# Patient Record
Sex: Female | Born: 1990
Health system: Southern US, Community
[De-identification: ages and names within clinical notes are randomized; demographics above are authoritative.]

## PROBLEM LIST (undated history)

## (undated) DIAGNOSIS — G43909 Migraine, unspecified, not intractable, without status migrainosus: Secondary | ICD-10-CM

## (undated) DIAGNOSIS — J45909 Unspecified asthma, uncomplicated: Secondary | ICD-10-CM

## (undated) DIAGNOSIS — M549 Dorsalgia, unspecified: Secondary | ICD-10-CM

## (undated) HISTORY — PX: WISDOM TOOTH EXTRACTION: SHX21

## (undated) HISTORY — PX: PLANTAR'S WART EXCISION: SHX2240

## (undated) HISTORY — DX: Migraine, unspecified, not intractable, without status migrainosus: G43.909

## (undated) HISTORY — DX: Unspecified asthma, uncomplicated: J45.909

## (undated) HISTORY — DX: Dorsalgia, unspecified: M54.9

---

## 2012-01-05 ENCOUNTER — Ambulatory Visit: Payer: Self-pay | Admitting: Audiology

## 2012-01-10 ENCOUNTER — Ambulatory Visit: Payer: Medicaid Other | Attending: Audiology | Admitting: Audiology

## 2012-01-10 DIAGNOSIS — F802 Mixed receptive-expressive language disorder: Secondary | ICD-10-CM | POA: Insufficient documentation

## 2016-12-21 ENCOUNTER — Ambulatory Visit (INDEPENDENT_AMBULATORY_CARE_PROVIDER_SITE_OTHER): Payer: Self-pay | Admitting: Physician Assistant

## 2016-12-21 ENCOUNTER — Encounter: Payer: Self-pay | Admitting: Physician Assistant

## 2016-12-21 VITALS — BP 117/80 | HR 83 | Temp 97.0°F | Ht 63.0 in | Wt 120.0 lb

## 2016-12-21 DIAGNOSIS — W902XXA Exposure to laser radiation, initial encounter: Secondary | ICD-10-CM | POA: Insufficient documentation

## 2016-12-21 DIAGNOSIS — N949 Unspecified condition associated with female genital organs and menstrual cycle: Secondary | ICD-10-CM

## 2016-12-21 MED ORDER — SILVER SULFADIAZINE 1 % EX CREA: 1.0000 "application " | TOPICAL_CREAM | Freq: Two times a day (BID) | CUTANEOUS | 1 refills | Status: DC

## 2016-12-21 MED ORDER — FLUCONAZOLE 150 MG PO TABS: ORAL_TABLET | ORAL | 2 refills | Status: DC

## 2016-12-21 NOTE — Patient Instructions (Signed)
In a few days you may receive a survey in the mail or online from [employer] regarding your visit with us today. Please take a moment to fill this out. Your feedback is very important to our whole office. It can help us better understand your needs as well as improve your experience and satisfaction. Thank you for taking your time to complete it. We care about you.  Cassity Christian, PA-C  

## 2016-12-21 NOTE — Progress Notes (Signed)
BP 117/80   Pulse 83   Temp (!) 97 F (36.1 C) (Oral)   Ht 5\' 3"  (1.6 m)   Wt 120 lb (54.4 kg)   LMP 12/14/2016   BMI 21.26 kg/m    Subjective:    Patient ID: Holly Parks, female    DOB: [birth year], 26 y.o.   MRN: [id number]  HPI: Holly Parks is a 26 y.o. female presenting on 12/21/2016 for Vaginal Itching (red and irritated )  Patient reports that she has had 2 laser hair treatments to the pubic area. These time she has had redness and swelling this last time has been quite significant. She is even having to postpone her treatment again. She has a picture of it 3 weeks ago where there was significant swelling on both labial surfaces. The pubic mons is without abnormality. The swelling has gone down a lot. There is no associated folliculitis.  Relevant past medical, surgical, family and social history reviewed and updated as indicated. Allergies and medications reviewed and updated.  History reviewed. No pertinent past medical history.  History reviewed. No pertinent surgical history.  Review of Systems  Constitutional: Negative.   HENT: Negative.   Eyes: Negative.   Respiratory: Negative.   Gastrointestinal: Negative.   Genitourinary: Negative.   Skin: Positive for color change and rash.    Allergies as of 12/21/2016      Reactions   Penicillins    Benadryl [diphenhydramine Hcl] Rash      Medication List       Accurate as of 12/21/16  2:30 PM. Always use your most recent med list.          fluconazole 150 MG tablet Commonly known as:  DIFLUCAN 1 po q week x 4 weeks   silver sulfADIAZINE 1 % cream Commonly known as:  SILVADENE Apply 1 application topically 2 (two) times daily.   valACYclovir 500 MG tablet Commonly known as:  VALTREX Take 500 mg by mouth daily.          Objective:    BP 117/80   Pulse 83   Temp (!) 97 F (36.1 C) (Oral)   Ht 5\' 3"  (1.6 m)   Wt 120 lb (54.4 kg)   LMP 12/14/2016   BMI 21.26 kg/m   Allergies  Allergen  Reactions  . Penicillins   . Benadryl [Diphenhydramine Hcl] Rash    Physical Exam  Constitutional: She is oriented to person, place, and time. She appears well-developed and well-nourished.  HENT:  Head: Normocephalic and atraumatic.  Eyes: Pupils are equal, round, and reactive to light. Conjunctivae and EOM are normal.  Cardiovascular: Normal rate, regular rhythm, normal heart sounds and intact distal pulses.   Pulmonary/Chest: Effort normal and breath sounds normal.  Abdominal: Soft. Bowel sounds are normal.  Genitourinary:    There is tenderness and lesion on the right labia. There is tenderness and lesion on the left labia.  Genitourinary Comments: Redness of both labia secondary to laser treatment  Neurological: She is alert and oriented to person, place, and time. She has normal reflexes.  Skin: Skin is warm and dry. No rash noted.  Psychiatric: She has a normal mood and affect. Her behavior is normal. Judgment and thought content normal.    No results found for this or any previous visit.    Assessment & Plan:   1. Labial burning Secondary to laser treatment Silvadene cream applied twice daily to affected area Diflucan 1 weekly as needed for associated vaginal itching  2. Injury due to laser    Current Outpatient Prescriptions:  .  valACYclovir (VALTREX) 500 MG tablet, Take 500 mg by mouth daily., Disp: , Rfl:  .  fluconazole (DIFLUCAN) 150 MG tablet, 1 po q week x 4 weeks, Disp: 4 tablet, Rfl: 2 .  silver sulfADIAZINE (SILVADENE) 1 % cream, Apply 1 application topically 2 (two) times daily., Disp: 85 g, Rfl: 1  Continue all other maintenance medications as listed above.  Follow up plan: Return if symptoms worsen or fail to improve.  Educational handout given for survey  Remus LofflerAngel S. Genoa Freyre PA-C [facility] [address]  [place], [place] [id number] [phone]   12/21/2016, 2:30 PM

## 2017-06-07 ENCOUNTER — Ambulatory Visit: Payer: Self-pay | Admitting: Physician Assistant

## 2017-06-08 ENCOUNTER — Ambulatory Visit: Payer: BLUE CROSS/BLUE SHIELD | Admitting: Physician Assistant

## 2017-06-08 ENCOUNTER — Encounter: Payer: Self-pay | Admitting: Physician Assistant

## 2017-06-08 VITALS — BP 124/85 | HR 97 | Temp 98.4°F | Ht 63.0 in | Wt 124.0 lb

## 2017-06-08 DIAGNOSIS — K921 Melena: Secondary | ICD-10-CM

## 2017-06-08 DIAGNOSIS — J40 Bronchitis, not specified as acute or chronic: Secondary | ICD-10-CM

## 2017-06-08 DIAGNOSIS — K6289 Other specified diseases of anus and rectum: Secondary | ICD-10-CM | POA: Insufficient documentation

## 2017-06-08 DIAGNOSIS — R197 Diarrhea, unspecified: Secondary | ICD-10-CM | POA: Diagnosis not present

## 2017-06-08 MED ORDER — BENZONATATE 200 MG PO CAPS: 200.0000 mg | ORAL_CAPSULE | Freq: Three times a day (TID) | ORAL | 0 refills | Status: DC | PRN

## 2017-06-08 MED ORDER — AZITHROMYCIN 250 MG PO TABS: ORAL_TABLET | ORAL | 0 refills | Status: DC

## 2017-06-10 NOTE — Progress Notes (Signed)
BP 124/85   Pulse 97   Temp 98.4 F (36.9 C) (Oral)   Ht 5\' 3"  (1.6 m)   Wt 124 lb (56.2 kg)   BMI 21.97 kg/m    Subjective:    Patient ID: Holly Parks, female    DOB: 1990-07-20, 27 y.o.   MRN: [id number]  HPI: Holly Parks is a 27 y.o. 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  She states that it will enlarge, but it is not red.  She has had slight bleeding with bowel movements but it is not a chronic issue.  It will cause a lot of pain with sitting or having a bowel movement.  It also hurts with intercourse.  She is never been to a gastroenterologist before.  This patient has had many days of sore throat and postnasal drainage, headache at times and sinus pressure. There is copious drainage at times. Denies any fever at this time. There has been a history of sinus infections in the past.  There is cough at night. It has become more prevalent in recent days.   Relevant past medical, surgical, family and social history reviewed and updated as indicated. Allergies and medications reviewed and updated.  No past medical history on file.  No past surgical history on file.  Review of Systems  Constitutional: Positive for chills and fatigue. Negative for activity change and appetite change.  HENT: Positive for congestion, postnasal drip and sore throat.   Eyes: Negative.   Respiratory: Positive for cough and wheezing.   Cardiovascular: Negative.  Negative for chest pain, palpitations and leg swelling.  Gastrointestinal: Negative.  Negative for abdominal distention, abdominal pain, constipation, diarrhea and nausea.  Genitourinary: Negative.   Musculoskeletal: Negative.   Skin: Negative.   Neurological: Positive for headaches.    Allergies as of 06/08/2017      Reactions   Penicillins    Benadryl [diphenhydramine Hcl] Rash      Medication List        Accurate as of 06/08/17 11:59 PM.  Always use your most recent med list.          azithromycin 250 MG tablet Commonly known as:  ZITHROMAX Z-PAK Take as directed   benzonatate 200 MG capsule Commonly known as:  TESSALON Take 1 capsule (200 mg total) by mouth 3 (three) times daily as needed for cough.   valACYclovir 500 MG tablet Commonly known as:  VALTREX Take 500 mg by mouth daily.          Objective:    BP 124/85   Pulse 97   Temp 98.4 F (36.9 C) (Oral)   Ht 5\' 3"  (1.6 m)   Wt 124 lb (56.2 kg)   BMI 21.97 kg/m   Allergies  Allergen Reactions  . Penicillins   . Benadryl [Diphenhydramine Hcl] Rash    Physical Exam  Constitutional: She is oriented to person, place, and time. She appears well-developed and well-nourished.  HENT:  Head: Normocephalic and atraumatic.  Right Ear: A middle ear effusion is present.  Left Ear: A middle ear effusion is present.  Nose: Mucosal edema present. Right sinus exhibits no frontal sinus tenderness. Left sinus exhibits no frontal sinus tenderness.  Mouth/Throat: Posterior oropharyngeal erythema present. No oropharyngeal exudate or tonsillar abscesses.  Eyes: Conjunctivae and EOM are normal. Pupils are equal, round, and reactive to light.  Neck: Normal range of motion.  Cardiovascular: Normal rate, regular rhythm, normal  heart sounds and intact distal pulses.  Pulmonary/Chest: Effort normal and breath sounds normal.  Abdominal: Soft. Bowel sounds are normal.  Neurological: She is alert and oriented to person, place, and time. She has normal reflexes.  Skin: Skin is warm and dry. No rash noted.  Psychiatric: She has a normal mood and affect. Her behavior is normal. Judgment and thought content normal.  Nursing note and vitals reviewed.   No results found for this or any previous visit.    Assessment & Plan:   1. Diarrhea, unspecified type - Ambulatory referral to Gastroenterology  2. Hematochezia  - Ambulatory referral to Gastroenterology  3.  Bronchitis  4. Rectal pain    Current Outpatient Medications:  .  valACYclovir (VALTREX) 500 MG tablet, Take 500 mg by mouth daily., Disp: , Rfl:  .  azithromycin (ZITHROMAX Z-PAK) 250 MG tablet, Take as directed, Disp: 6 each, Rfl: 0 .  benzonatate (TESSALON) 200 MG capsule, Take 1 capsule (200 mg total) by mouth 3 (three) times daily as needed for cough., Disp: 30 capsule, Rfl: 0 Continue all other maintenance medications as listed above.  Follow up plan: No Follow-up on file.  Educational handout given for survey  Remus Loffler PA-C [facility] [address]  [place], [place] [phone]   06/10/2017, 10:48 AM

## 2017-06-10 NOTE — Patient Instructions (Signed)
In a few days you may receive a survey in the mail or online from [employer] regarding your visit with us today. Please take a moment to fill this out. Your feedback is very important to our whole office. It can help us better understand your needs as well as improve your experience and satisfaction. Thank you for taking your time to complete it. We care about you.  Lequita Meadowcroft, PA-C  

## 2017-06-13 ENCOUNTER — Ambulatory Visit (INDEPENDENT_AMBULATORY_CARE_PROVIDER_SITE_OTHER): Payer: BLUE CROSS/BLUE SHIELD | Admitting: Internal Medicine

## 2017-06-13 ENCOUNTER — Encounter (INDEPENDENT_AMBULATORY_CARE_PROVIDER_SITE_OTHER): Payer: Self-pay | Admitting: Internal Medicine

## 2017-06-13 VITALS — BP 108/70 | HR 66 | Temp 98.1°F | Resp 18 | Ht 63.0 in | Wt 121.7 lb

## 2017-06-13 DIAGNOSIS — K921 Melena: Secondary | ICD-10-CM

## 2017-06-13 DIAGNOSIS — R197 Diarrhea, unspecified: Secondary | ICD-10-CM

## 2017-06-13 DIAGNOSIS — R1030 Lower abdominal pain, unspecified: Secondary | ICD-10-CM | POA: Diagnosis not present

## 2017-06-13 MED ORDER — DICYCLOMINE HCL 10 MG PO CAPS: 10.0000 mg | ORAL_CAPSULE | Freq: Three times a day (TID) | ORAL | 2 refills | Status: DC

## 2017-06-13 NOTE — Progress Notes (Signed)
Presenting complaint;  Lower abdominal pain diarrhea and rectal bleeding.  History of present illness.:  Patient is 27 year old Caucasian female who is referred through courtesy of Ms. Prudy FeelerAngel Jones PA-C for GI evaluation. Patient says she has been having pruritus and eye for about 12 months.  She also has noted intermittent lump at the anal orifice which comes and goes.  She feels it is a skin tag.  For the last 2 months she has noted blood on wiping with bowel movements.  She also complains of diarrhea.  She has anywhere from 4-5 stools per day.  She has never experienced constipation.  She also complains of pain across her upper abdomen which she describes as ripping pain.  Her periods have been regular.  Lately she has had more pain with it..  She is also having migraine.  Her appetite is not good however she has not lost any weight.  Instead she has gained 6 pounds in the last few months.  She denies nausea vomiting fever chills or night sweats.  She also denies hematuria or dysuria.  She has not tried any OTC medications for symptoms.  Current Medications: Outpatient Encounter Medications as of 06/13/2017  Medication Sig  . azithromycin (ZITHROMAX Z-PAK) 250 MG tablet Take as directed  . valACYclovir (VALTREX) 500 MG tablet Take 500 mg by mouth daily.  . benzonatate (TESSALON) 200 MG capsule Take 1 capsule (200 mg total) by mouth 3 (three) times daily as needed for cough. (Patient not taking: Reported on 06/13/2017)   No facility-administered encounter medications on file as of 06/13/2017.    Past medical history:  Patient has had multiple surgeries on both feet for removal of plantar warts. She has had wisdom teeth extracted. History of aphthous oral ulcers since 2009.Marland Kitchen.   Allergies: Allergies  Allergen Reactions  . Penicillins   . Benadryl [Diphenhydramine Hcl] Rash    Family history:  Father is 25 year old and has a history of AML. Mother is 25 year old and has IBS. She has a  brother age 25 in good health.  Social history:  Patient is single.  She graduated from high school in 2013.  She works as a Social workernanny.  She does not smoke cigarettes or drink alcohol.  Physical examination: Blood pressure 108/70, pulse 66, temperature 98.1 F (36.7 C), temperature source Oral, resp. rate 18, height 5\' 3"  (1.6 m), weight 121 lb 11.2 oz (55.2 kg), last menstrual period 05/24/2017. Patient is alert and in no acute distress. Conjunctiva is pink. Sclera is nonicteric Oropharyngeal mucosa is normal. No neck masses or thyromegaly noted. Cardiac exam with regular rhythm normal S1 and S2. No murmur or gallop noted. Lungs are clear to auscultation. Abdomen is symmetrical.  Bowel sounds are normal.  On palpation it soft and nontender with organomegaly or masses. Rectal examination was limited to external inspection and no abnormality noted. No LE edema or clubbing noted.  Labs/studies Results:  No lab data available.  Assessment:  Patient is a 27 year old Caucasian female who presents with 1 year history of pruritus and I as she possibly has small anal skin tag not obvious on today's exam and now she has been experiencing lower abdominal pain diarrhea and intermittent hematochezia for the last 2 months.  She also has loss of appetite without weight loss. Her symptoms may be due to irritable bowel syndrome and bleeding secondary to hemorrhoids.  However need to rule out inflammatory bowel disease.   Recommendations:  Patient will go to the lab for CBC with  differential, sed rate and CRP. Dicyclomine 10 mg by mouth before each meal. Patient will keep symptom diary over the next 2 weeks. If lab studies are unremarkable will proceed with diagnostic flexible sigmoidoscopy.  However if she is anemic or sed rate or CRP are elevated would consider diagnostic colonoscopy. Office visit in 3 months.

## 2017-06-13 NOTE — Patient Instructions (Addendum)
Physician will call with results of blood test and further recommendations. Please keep stool diary as to frequency consistency of stools and bleeding episodes for the next 2-3 weeks.

## 2017-06-14 LAB — CBC WITH DIFFERENTIAL/PLATELET
BASOS PCT: 0.3 %
Basophils Absolute: 18 cells/uL (ref 0–200)
EOS ABS: 31 {cells}/uL (ref 15–500)
EOS PCT: 0.5 %
HEMATOCRIT: 39.8 % (ref 35.0–45.0)
HEMOGLOBIN: 13.8 g/dL (ref 11.7–15.5)
LYMPHS ABS: 2324 {cells}/uL (ref 850–3900)
MCH: 30.4 pg (ref 27.0–33.0)
MCHC: 34.7 g/dL (ref 32.0–36.0)
MCV: 87.7 fL (ref 80.0–100.0)
MONOS PCT: 8.9 %
MPV: 9.6 fL (ref 7.5–12.5)
NEUTROS ABS: 3184 {cells}/uL (ref 1500–7800)
Neutrophils Relative %: 52.2 %
Platelets: 264 10*3/uL (ref 140–400)
RBC: 4.54 10*6/uL (ref 3.80–5.10)
RDW: 11.8 % (ref 11.0–15.0)
Total Lymphocyte: 38.1 %
WBC mixed population: 543 cells/uL (ref 200–950)
WBC: 6.1 10*3/uL (ref 3.8–10.8)

## 2017-06-14 LAB — C-REACTIVE PROTEIN: CRP: 1.6 mg/L (ref <8.0)

## 2017-06-14 LAB — SEDIMENTATION RATE: Sed Rate: 9 mm/h (ref 0–20)

## 2017-06-15 ENCOUNTER — Encounter (INDEPENDENT_AMBULATORY_CARE_PROVIDER_SITE_OTHER): Payer: Self-pay | Admitting: *Deleted

## 2017-06-20 ENCOUNTER — Other Ambulatory Visit (INDEPENDENT_AMBULATORY_CARE_PROVIDER_SITE_OTHER): Payer: Self-pay | Admitting: Internal Medicine

## 2017-06-20 DIAGNOSIS — K625 Hemorrhage of anus and rectum: Secondary | ICD-10-CM

## 2017-06-21 ENCOUNTER — Encounter (INDEPENDENT_AMBULATORY_CARE_PROVIDER_SITE_OTHER): Payer: Self-pay | Admitting: *Deleted

## 2017-06-23 ENCOUNTER — Telehealth (INDEPENDENT_AMBULATORY_CARE_PROVIDER_SITE_OTHER): Payer: Self-pay | Admitting: *Deleted

## 2017-06-23 NOTE — Telephone Encounter (Signed)
noted 

## 2017-06-23 NOTE — Telephone Encounter (Signed)
FYI: patient called wants to cancel FS sch'd 07/11/17

## 2017-07-11 ENCOUNTER — Ambulatory Visit (HOSPITAL_COMMUNITY)
Admission: RE | Admit: 2017-07-11 | Payer: BLUE CROSS/BLUE SHIELD | Source: Ambulatory Visit | Admitting: Internal Medicine

## 2017-07-11 ENCOUNTER — Encounter (HOSPITAL_COMMUNITY): Admission: RE | Payer: Self-pay | Source: Ambulatory Visit

## 2017-07-11 SURGERY — SIGMOIDOSCOPY, FLEXIBLE
Anesthesia: Moderate Sedation

## 2017-07-25 DIAGNOSIS — Z01419 Encounter for gynecological examination (general) (routine) without abnormal findings: Secondary | ICD-10-CM | POA: Diagnosis not present

## 2017-07-25 DIAGNOSIS — Z6821 Body mass index (BMI) 21.0-21.9, adult: Secondary | ICD-10-CM | POA: Diagnosis not present

## 2017-07-29 ENCOUNTER — Other Ambulatory Visit: Payer: Self-pay | Admitting: Physician Assistant

## 2017-08-03 DIAGNOSIS — F329 Major depressive disorder, single episode, unspecified: Secondary | ICD-10-CM | POA: Diagnosis not present

## 2017-08-03 DIAGNOSIS — F419 Anxiety disorder, unspecified: Secondary | ICD-10-CM | POA: Diagnosis not present

## 2017-08-23 DIAGNOSIS — F419 Anxiety disorder, unspecified: Secondary | ICD-10-CM | POA: Diagnosis not present

## 2017-08-23 DIAGNOSIS — F329 Major depressive disorder, single episode, unspecified: Secondary | ICD-10-CM | POA: Diagnosis not present

## 2017-08-31 DIAGNOSIS — F419 Anxiety disorder, unspecified: Secondary | ICD-10-CM | POA: Diagnosis not present

## 2017-08-31 DIAGNOSIS — F329 Major depressive disorder, single episode, unspecified: Secondary | ICD-10-CM | POA: Diagnosis not present

## 2017-09-13 ENCOUNTER — Encounter (INDEPENDENT_AMBULATORY_CARE_PROVIDER_SITE_OTHER): Payer: Self-pay | Admitting: Internal Medicine

## 2017-09-13 ENCOUNTER — Ambulatory Visit (INDEPENDENT_AMBULATORY_CARE_PROVIDER_SITE_OTHER): Payer: BLUE CROSS/BLUE SHIELD | Admitting: Internal Medicine

## 2017-09-19 DIAGNOSIS — F329 Major depressive disorder, single episode, unspecified: Secondary | ICD-10-CM | POA: Diagnosis not present

## 2017-09-19 DIAGNOSIS — F419 Anxiety disorder, unspecified: Secondary | ICD-10-CM | POA: Diagnosis not present

## 2017-10-03 DIAGNOSIS — F419 Anxiety disorder, unspecified: Secondary | ICD-10-CM | POA: Diagnosis not present

## 2017-10-03 DIAGNOSIS — F329 Major depressive disorder, single episode, unspecified: Secondary | ICD-10-CM | POA: Diagnosis not present

## 2017-11-10 DIAGNOSIS — J209 Acute bronchitis, unspecified: Secondary | ICD-10-CM | POA: Diagnosis not present

## 2017-11-14 DIAGNOSIS — F329 Major depressive disorder, single episode, unspecified: Secondary | ICD-10-CM | POA: Diagnosis not present

## 2017-11-14 DIAGNOSIS — F419 Anxiety disorder, unspecified: Secondary | ICD-10-CM | POA: Diagnosis not present

## 2017-11-28 DIAGNOSIS — F329 Major depressive disorder, single episode, unspecified: Secondary | ICD-10-CM | POA: Diagnosis not present

## 2017-11-28 DIAGNOSIS — F419 Anxiety disorder, unspecified: Secondary | ICD-10-CM | POA: Diagnosis not present

## 2017-12-08 DIAGNOSIS — H1131 Conjunctival hemorrhage, right eye: Secondary | ICD-10-CM | POA: Diagnosis not present

## 2017-12-12 DIAGNOSIS — F419 Anxiety disorder, unspecified: Secondary | ICD-10-CM | POA: Diagnosis not present

## 2017-12-12 DIAGNOSIS — F329 Major depressive disorder, single episode, unspecified: Secondary | ICD-10-CM | POA: Diagnosis not present

## 2018-01-10 DIAGNOSIS — F329 Major depressive disorder, single episode, unspecified: Secondary | ICD-10-CM | POA: Diagnosis not present

## 2018-01-10 DIAGNOSIS — F419 Anxiety disorder, unspecified: Secondary | ICD-10-CM | POA: Diagnosis not present

## 2018-10-18 DIAGNOSIS — F4320 Adjustment disorder, unspecified: Secondary | ICD-10-CM | POA: Diagnosis not present

## 2018-10-26 DIAGNOSIS — F4320 Adjustment disorder, unspecified: Secondary | ICD-10-CM | POA: Diagnosis not present

## 2018-11-01 DIAGNOSIS — F4320 Adjustment disorder, unspecified: Secondary | ICD-10-CM | POA: Diagnosis not present

## 2018-11-15 DIAGNOSIS — F4320 Adjustment disorder, unspecified: Secondary | ICD-10-CM | POA: Diagnosis not present

## 2018-11-29 DIAGNOSIS — F4320 Adjustment disorder, unspecified: Secondary | ICD-10-CM | POA: Diagnosis not present

## 2018-12-13 DIAGNOSIS — F4320 Adjustment disorder, unspecified: Secondary | ICD-10-CM | POA: Diagnosis not present

## 2018-12-20 DIAGNOSIS — F4320 Adjustment disorder, unspecified: Secondary | ICD-10-CM | POA: Diagnosis not present

## 2018-12-25 DIAGNOSIS — F4320 Adjustment disorder, unspecified: Secondary | ICD-10-CM | POA: Diagnosis not present

## 2019-01-15 DIAGNOSIS — F4320 Adjustment disorder, unspecified: Secondary | ICD-10-CM | POA: Diagnosis not present

## 2019-02-06 DIAGNOSIS — F4320 Adjustment disorder, unspecified: Secondary | ICD-10-CM | POA: Diagnosis not present

## 2019-03-01 DIAGNOSIS — F4320 Adjustment disorder, unspecified: Secondary | ICD-10-CM | POA: Diagnosis not present

## 2020-06-03 ENCOUNTER — Other Ambulatory Visit: Payer: Self-pay

## 2020-06-03 ENCOUNTER — Emergency Department (HOSPITAL_COMMUNITY): Payer: Self-pay

## 2020-06-03 ENCOUNTER — Telehealth: Payer: Self-pay

## 2020-06-03 ENCOUNTER — Encounter (HOSPITAL_COMMUNITY): Payer: Self-pay | Admitting: Emergency Medicine

## 2020-06-03 ENCOUNTER — Ambulatory Visit: Admit: 2020-06-03 | Disposition: A | Discharge status: Home / Self Care | Payer: Self-pay

## 2020-06-03 ENCOUNTER — Ambulatory Visit
Admission: EM | Admit: 2020-06-03 | Discharge: 2020-06-03 | Disposition: A | Discharge status: Home / Self Care | Payer: BLUE CROSS/BLUE SHIELD

## 2020-06-03 ENCOUNTER — Emergency Department (HOSPITAL_COMMUNITY)
Admission: EM | Admit: 2020-06-03 | Discharge: 2020-06-03 | Disposition: A | Discharge status: Home / Self Care | Payer: Self-pay | Attending: Emergency Medicine | Admitting: Emergency Medicine

## 2020-06-03 DIAGNOSIS — R1110 Vomiting, unspecified: Secondary | ICD-10-CM | POA: Insufficient documentation

## 2020-06-03 DIAGNOSIS — Z20822 Contact with and (suspected) exposure to covid-19: Secondary | ICD-10-CM | POA: Insufficient documentation

## 2020-06-03 DIAGNOSIS — R197 Diarrhea, unspecified: Secondary | ICD-10-CM | POA: Insufficient documentation

## 2020-06-03 DIAGNOSIS — K219 Gastro-esophageal reflux disease without esophagitis: Secondary | ICD-10-CM | POA: Insufficient documentation

## 2020-06-03 DIAGNOSIS — R1031 Right lower quadrant pain: Secondary | ICD-10-CM | POA: Insufficient documentation

## 2020-06-03 LAB — URINALYSIS, ROUTINE W REFLEX MICROSCOPIC
Bilirubin Urine: NEGATIVE
Glucose, UA: NEGATIVE mg/dL
Ketones, ur: 20 mg/dL — AB
Nitrite: NEGATIVE
Protein, ur: NEGATIVE mg/dL
Specific Gravity, Urine: 1.006 (ref 1.005–1.030)
pH: 6 (ref 5.0–8.0)

## 2020-06-03 LAB — COMPREHENSIVE METABOLIC PANEL
ALT: 100 U/L — ABNORMAL HIGH (ref 0–44)
AST: 100 U/L — ABNORMAL HIGH (ref 15–41)
Albumin: 3.9 g/dL (ref 3.5–5.0)
Alkaline Phosphatase: 50 U/L (ref 38–126)
Anion gap: 9 (ref 5–15)
BUN: 11 mg/dL (ref 6–20)
CO2: 23 mmol/L (ref 22–32)
Calcium: 8 mg/dL — ABNORMAL LOW (ref 8.9–10.3)
Chloride: 103 mmol/L (ref 98–111)
Creatinine, Ser: 0.66 mg/dL (ref 0.44–1.00)
GFR, Estimated: >60 mL/min (ref >60)
Glucose, Bld: 94 mg/dL (ref 70–99)
Potassium: 2.7 mmol/L — CL (ref 3.5–5.1)
Sodium: 135 mmol/L (ref 135–145)
Total Bilirubin: 0.7 mg/dL (ref 0.3–1.2)
Total Protein: 7 g/dL (ref 6.5–8.1)

## 2020-06-03 LAB — CBC
HCT: 41.6 % (ref 36.0–46.0)
Hemoglobin: 14 g/dL (ref 12.0–15.0)
MCH: 30.4 pg (ref 26.0–34.0)
MCHC: 33.7 g/dL (ref 30.0–36.0)
MCV: 90.2 fL (ref 80.0–100.0)
Platelets: 164 10*3/uL (ref 150–400)
RBC: 4.61 MIL/uL (ref 3.87–5.11)
RDW: 12 % (ref 11.5–15.5)
WBC: 6.8 10*3/uL (ref 4.0–10.5)
nRBC: 0 % (ref 0.0–0.2)

## 2020-06-03 LAB — MAGNESIUM: Magnesium: 1.9 mg/dL (ref 1.7–2.4)

## 2020-06-03 LAB — POC URINE PREG, ED: Preg Test, Ur: NEGATIVE

## 2020-06-03 LAB — LIPASE, BLOOD: Lipase: 23 U/L (ref 11–51)

## 2020-06-03 MED ORDER — POTASSIUM CHLORIDE 20 MEQ PO PACK
40.0000 meq | PACK | Freq: Once | ORAL | Status: AC
  Administered 2020-06-03: 40 meq via ORAL
  Filled 2020-06-03: qty 2

## 2020-06-03 MED ORDER — LOPERAMIDE HCL 2 MG PO CAPS
2.0000 mg | ORAL_CAPSULE | Freq: Once | ORAL | Status: AC
  Administered 2020-06-03: 2 mg via ORAL
  Filled 2020-06-03: qty 1

## 2020-06-03 MED ORDER — POTASSIUM CHLORIDE CRYS ER 20 MEQ PO TBCR: 20.0000 meq | EXTENDED_RELEASE_TABLET | Freq: Two times a day (BID) | ORAL | 0 refills | Status: DC

## 2020-06-03 MED ORDER — POTASSIUM CHLORIDE 10 MEQ/100ML IV SOLN
10.0000 meq | INTRAVENOUS | Status: AC
Start: 2020-06-03 — End: 2020-06-03
  Administered 2020-06-03 (×2): 10 meq via INTRAVENOUS
  Filled 2020-06-03 (×2): qty 100

## 2020-06-03 MED ORDER — IOHEXOL 300 MG/ML  SOLN
75.0000 mL | Freq: Once | INTRAMUSCULAR | Status: AC | PRN
  Administered 2020-06-03: 75 mL via INTRAVENOUS

## 2020-06-03 MED ORDER — POTASSIUM CHLORIDE CRYS ER 20 MEQ PO TBCR
40.0000 meq | EXTENDED_RELEASE_TABLET | Freq: Two times a day (BID) | ORAL | Status: DC
  Filled 2020-06-03: qty 2

## 2020-06-03 MED ORDER — SODIUM CHLORIDE 0.9 % IV BOLUS
1000.0000 mL | Freq: Once | INTRAVENOUS | Status: AC
  Administered 2020-06-03: 1000 mL via INTRAVENOUS

## 2020-06-03 NOTE — ED Triage Notes (Signed)
Pt c/o epigastric abdominal x 3 days.  Pt states she is bloated and has had v/d.

## 2020-06-03 NOTE — Telephone Encounter (Signed)
Patient reports she has had abdominal pain and bloating for 2 days.  She went to urgent care this morning and they advised she either go to ER or contact PCP for appointment, felt there was nothing they could do to help her.  Patient has not been seen in our office since 2019.  An appointment was scheduled on Monday, 06/09/20, at 8:30 am with Je.

## 2020-06-03 NOTE — ED Notes (Signed)
Patient is being discharged from the Urgent Care and sent to the Emergency Department via pov . Per k. Avegno, patient is in need of higher level of care due to abd pain. Patient is aware and verbalizes understanding of plan of care. There were no vitals filed for this visit.

## 2020-06-03 NOTE — Discharge Instructions (Signed)
You were evaluated in the emergency department today for your diarrhea.  Your physical exam and vital signs are very reassuring.  Your blood work revealed significantly low potassium.  You have been administered potassium through IV and orally while in the emergency department.  You have been prescribed 3 days worth of potassium supplementation at home which you should take the entire course.  CT scan revealed diarrheal illness, as well as a mild intussusception, or telescoping of your bowel. There is no sign of bowel obstruction. The general surgeon would like for you to follow up with her in the office on Thursday. Please call her office below to schedule an ER follow up.  Recommend follow-up with your primary care doctor in 1 week for reevaluation of your potassium and your diarrhea.   You may take over-the-counter Imodium as needed for your diarrhea.  Recommend you increase your hydration, with water and electrolyte drinks.  Below is a information regarding diet that may help relieve diarrhea.  I suspect your symptoms are secondary to a viral illness. You have been tested for COVID and flu; these results will come back within 24 hours.  You may follow these results on MyChart.  You may return to work once you have been fever free for 24 hours without the help of Tylenol or ibuprofen, and once you lower no longer having diarrhea - whichever is longer.   Return to the emergency department if you develop any worsening abdominal pain, nausea or vomiting does not stop, diarrhea that is nonstop, you become dehydrated, if you pass out, or if you develop any other new severe symptoms.

## 2020-06-03 NOTE — ED Triage Notes (Signed)
Pt having severe epi gastric pain for past few days

## 2020-06-03 NOTE — ED Provider Notes (Signed)
[facility] EMERGENCY DEPARTMENT Provider Note   CSN: [id number] Arrival date & time: 06/03/20  1522     History Chief Complaint  Patient presents with  . Abdominal Pain    [name] is a 30 y.o. female who presents with 3 days of burning epigastric pain, crampy lower abdominal pain, abdominal bloating, and diarrhea that she describes as white/cream in color, today now primarily liquid.  Denies melena or hematochezia.  Patient endorses single episode of NBNB emesis today.  She denies dysuria, hematuria, urinary frequency or urgency.    She states LMP was December 2021, however she does not remember the date.  She is having regular menstrual cycles.  She is not on any kind of birth control, however she is sexually active in monogamous relationship with a female.  She endorses occasional use of barrier protection with condoms during sex.  States she is not currently trying to get pregnant.  Denies history of STDs.  She denies chest pain, shortness of breath, palpitations, congestion, sore throat, loss of taste or smell.  She does endorse episode this morning of sensation that she was going to pass out.  States she woke from her sleep at 3 AM, clammy and hot, "just like I felt the last time before I passed out".  She states that she applied a cool cloth to her face and took off layer of clothing to cool down and was able to avoid syncope.  She presents today due to concern for copious amounts of diarrhea, she states that nearly every 5 minutes she is now passing large amounts of liquidy white-yellow stool.  Patient is not vaccinated against COVID-19, denies known sick contacts.  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  During the daytime she cares for approximately 6 children in her home.  Patient presented to urgent care this morning, however was directed to present to the emergency department due to concern for abdominal pain.  Patient states she  has not eaten anything for 2 days.  I personally reviewed this patient's medical record.  She does not carry medical diagnoses and is not on any medications every day.  HPI     History reviewed. No pertinent past medical history.  Patient Active Problem List   Diagnosis Date Noted  . Rectal bleeding 06/20/2017  . Rectal pain 06/08/2017  . Labial burning 12/21/2016  . Injury due to laser 12/21/2016    Past Surgical History:  Procedure Laterality Date  . PLANTAR'S WART EXCISION Bilateral    These were done when she was in [employer]  . WISDOM TOOTH EXTRACTION Bilateral    Patient was 30 years old when this was done.     OB History   No obstetric history on file.     Family History  Problem Relation Age of Onset  . Hernia Mother   . Other Mother   . Leukemia Father   . Healthy Brother     Social History   Tobacco Use  . Smoking status: Never Smoker  . Smokeless tobacco: Never Used  Vaping Use  . Vaping Use: Never used  Substance Use Topics  . Alcohol use: No  . Drug use: No    Home Medications Prior to Admission medications   Medication Sig Start Date End Date Taking? Authorizing Provider  potassium chloride SA (KLOR-CON) 20 MEQ tablet Take 1 tablet (20 mEq total) by mouth 2 (two) times daily for 3 days.  06/03/20 06/06/20 Yes Kathey Simer, Lupe Carney R, PA-C  acyclovir (ZOVIRAX) 400 MG tablet TAKE 1 TABLET 4 TIMES A DAY FOR 10 DAYS 08/01/17   Remus Loffler, PA-C  azithromycin (ZITHROMAX Z-PAK) 250 MG tablet Take as directed 06/08/17   Remus Loffler, PA-C  benzonatate (TESSALON) 200 MG capsule Take 1 capsule (200 mg total) by mouth 3 (three) times daily as needed for cough. Patient not taking: Reported on 06/13/2017 06/08/17   Remus Loffler, PA-C  dicyclomine (BENTYL) 10 MG capsule Take 1 capsule (10 mg total) by mouth 3 (three) times daily before meals. 06/13/17   Rehman, Joline Maxcy, MD  valACYclovir (VALTREX) 500 MG tablet Take 500 mg by mouth daily.    [provider]    Allergies    Penicillins and Benadryl [diphenhydramine hcl]  Review of Systems   Review of Systems  Constitutional: Positive for appetite change, chills and fever. Negative for activity change and fatigue.  HENT: Negative.   Eyes: Negative.   Respiratory: Negative.   Cardiovascular: Negative.   Gastrointestinal: Positive for abdominal distention, abdominal pain, diarrhea, nausea, rectal pain and vomiting. Negative for blood in stool and constipation.  Genitourinary: Negative for decreased urine volume, difficulty urinating, dysuria, flank pain, hematuria, pelvic pain, urgency, vaginal bleeding, vaginal discharge and vaginal pain.  Musculoskeletal: Negative.   Skin: Negative.   Neurological: Negative for dizziness, tremors, seizures, facial asymmetry, speech difficulty, weakness, light-headedness, numbness and headaches.  Hematological: Negative.   Psychiatric/Behavioral: Negative.     Physical Exam Updated Vital Signs BP 105/75 (BP Location: Left Arm)   Pulse 87   Temp 98.8 F (37.1 C) (Oral)   Resp 20   Ht 5\' 3"  (1.6 m)   Wt 59.9 kg   LMP  (LMP Unknown)   SpO2 100%   BMI 23.38 kg/m   Physical Exam Vitals and nursing note reviewed.  Constitutional:      Appearance: Normal appearance. She is normal weight. She is not ill-appearing.  HENT:     Head: Normocephalic and atraumatic.     Nose: Nose normal.     Mouth/Throat:     Mouth: Mucous membranes are moist.     Pharynx: Oropharynx is clear. Uvula midline. No oropharyngeal exudate or posterior oropharyngeal erythema.     Tonsils: No tonsillar exudate.  Eyes:     General: Lids are normal.        Right eye: No discharge.        Left eye: No discharge.     Extraocular Movements: Extraocular movements intact.     Conjunctiva/sclera: Conjunctivae normal.     Pupils: Pupils are equal, round, and reactive to light.  Neck:     Trachea: Trachea and phonation normal.  Cardiovascular:     Rate and Rhythm:  Normal rate and regular rhythm.     Pulses: Normal pulses.          Radial pulses are 2+ on the right side and 2+ on the left side.       Dorsalis pedis pulses are 2+ on the right side and 2+ on the left side.     Heart sounds: Normal heart sounds. No murmur heard.   Pulmonary:     Effort: Pulmonary effort is normal. No respiratory distress.     Breath sounds: Normal breath sounds. No wheezing or rales.  Chest:     Chest wall: No deformity, swelling, tenderness, crepitus or edema.  Abdominal:     General: Bowel sounds are increased.  There is no distension.     Palpations: Abdomen is soft.     Tenderness: There is abdominal tenderness in the right lower quadrant and suprapubic area. There is no right CVA tenderness, left CVA tenderness, guarding or rebound. Positive signs include McBurney's sign. Negative signs include psoas sign and obturator sign.  Musculoskeletal:        General: No deformity.     Cervical back: Neck supple. No rigidity or crepitus. No pain with movement or muscular tenderness.     Right lower leg: No edema.     Left lower leg: No edema.  Lymphadenopathy:     Cervical: No cervical adenopathy.  Skin:    General: Skin is warm and dry.     Capillary Refill: Capillary refill takes less than 2 seconds.  Neurological:     General: No focal deficit present.     Mental Status: She is alert and oriented to person, place, and time. Mental status is at baseline.     Sensory: Sensation is intact.     Motor: Motor function is intact.     Gait: Gait is intact.  Psychiatric:        Mood and Affect: Mood normal.     ED Results / Procedures / Treatments   Labs (all labs ordered are listed, but only abnormal results are displayed) Labs Reviewed  COMPREHENSIVE METABOLIC PANEL - Abnormal; Notable for the following components:      Result Value   Potassium 2.7 (*)    Calcium 8.0 (*)    AST 100 (*)    ALT 100 (*)    All other components within normal limits  URINALYSIS,  ROUTINE W REFLEX MICROSCOPIC - Abnormal; Notable for the following components:   APPearance HAZY (*)    Hgb urine dipstick MODERATE (*)    Ketones, ur 20 (*)    Leukocytes,Ua TRACE (*)    Bacteria, UA MANY (*)    All other components within normal limits  SARS CORONAVIRUS 2 (TAT 6-24 HRS)  LIPASE, BLOOD  CBC  MAGNESIUM  POC URINE PREG, ED    EKG EKG: normal sinus rhythm, no STEMI.  Radiology CT Abdomen Pelvis W Contrast  Result Date: 06/03/2020 CLINICAL DATA:  Right lower quadrant abdominal pain. EXAM: CT ABDOMEN AND PELVIS WITH CONTRAST TECHNIQUE: Multidetector CT imaging of the abdomen and pelvis was performed using the standard protocol following bolus administration of intravenous contrast. CONTRAST:  54mL OMNIPAQUE IOHEXOL 300 MG/ML  SOLN COMPARISON:  None. FINDINGS: Lower chest: The lung bases are clear. The heart size is normal. Hepatobiliary: The liver is normal. Normal gallbladder.There is no biliary ductal dilation. Pancreas: Normal contours without ductal dilatation. No peripancreatic fluid collection. Spleen: Unremarkable. Adrenals/Urinary Tract: --Adrenal glands: Unremarkable. --Right kidney/ureter: No hydronephrosis or radiopaque kidney stones. --Left kidney/ureter: No hydronephrosis or radiopaque kidney stones. --Urinary bladder: Unremarkable. Stomach/Bowel: --Stomach/Duodenum: No hiatal hernia or other gastric abnormality. Normal duodenal course and caliber. --Small bowel: There is scattered fluid-filled loops of small bowel in the abdomen. There is a focal intussusception of small bowel in the midline abdomen (axial series 2, image 43). There is apparent wall thickening of the small bowel at this level. --Colon: There is liquid stool throughout the colon --Appendix: Normal. Vascular/Lymphatic: Normal course and caliber of the major abdominal vessels. --No retroperitoneal lymphadenopathy. --No mesenteric lymphadenopathy. --No pelvic or inguinal lymphadenopathy. Reproductive:  Unremarkable Other: No ascites or free air. The abdominal wall is normal. Musculoskeletal. No acute displaced fractures. IMPRESSION: 1. Normal appendix. 2.  Incidentally noted intussusception in the mid abdomen as detailed above. There is no evidence for obstruction at this level. There is mild wall thickening of the loops of small bowel within the intussusception which may be secondary to an underlying enteritis. 3. Liquid stool throughout the colon consistent with a diarrheal illness. Electronically Signed   By: Katherine Mantle M.D.   On: 06/03/2020 19:13    Procedures Procedures (including critical care time)  Medications Ordered in ED Medications  potassium chloride 10 mEq in 100 mL IVPB (10 mEq Intravenous New Bag/Given 06/03/20 2059)  sodium chloride 0.9 % bolus 1,000 mL (0 mLs Intravenous Stopped 06/03/20 2058)  iohexol (OMNIPAQUE) 300 MG/ML solution 75 mL (75 mLs Intravenous Contrast Given 06/03/20 1836)  loperamide (IMODIUM) capsule 2 mg (2 mg Oral Given 06/03/20 1941)  potassium chloride (KLOR-CON) packet 40 mEq (40 mEq Oral Given 06/03/20 1941)    ED Course  I have reviewed the triage vital signs and the nursing notes.  Pertinent labs & imaging results that were available during my care of the patient were reviewed by me and considered in my medical decision making (see chart for details).    MDM Rules/Calculators/A&P                         30 year old female presents with concern for 3 days of worsening epigastric and lower abdominal pain and diarrhea. Patient is not vaccinated against COVID-19.  Differential diagnosis of epigastric pain and RLQ includes but is not limited to: PUD, GERD, Gastritis,  pancreatitis / pancreatic cancer, overeating indigestion , drug reaction, malabsorption, parasitic infection, abdominal hernia, intestinal ischemia, esophageal rupture, cholelithiasis /choledocholithiasis / cholangitis, hepatitis, ACS, pericarditis, pneumonia, pregnancy, appendicitis,  gastroenteritis, PID, TOA, psoas abscess, endometriosis.    Patient was tachycardic on intake to 110 bpm, otherwise vital signs were normal.  Physical exam is reassuring. Cardiopulmonary exam is normal, patient is no longer tachycardic at the time of my exam.  Abdominal exam significant for right lower quadrant tenderness to palpation, as well as suprapubic tenderness to palpation.  We will proceed with basic laboratory studies, UA, and CT abdomen pelvis.  CBC unremarkable, CMP with critical value hypokalemia of 2.7.  Will replete with IV and oral potassium.  Additionally patient has mild transaminitis with AST/ALT 100/100.  Total bilirubin is normal, 0.7.  Kidney function normal.  Lipase is normal, 23.  EKG is reassuring, with some U waves, NSR, no STEMI.  CT abdomen pelvis with liquid stool throughout the colon consistent with diarrheal illness, incidentally noted intussusception in the mid abdomen without evidence of obstruction.  Will have patient follow-up outpatient with Dr. Henreitta Leber, surgeon, who reviewed the CT scan and feels outpatient follow-up is appropriate.  Patient reevaluated after administration of medication.  She is currently receiving both p.o. and IV potassium supplementation.  She endorses relief from her abdominal pain, has not had an episode of diarrhea for approximately 3 hours.  Given reassuring physical exam, vital signs, and CT scan no further work-up is warranted in the emergency department at this time.  Suspect patient symptoms are secondary to acute viral illness.  She has been tested for COVID-19, and influenza A/B; results can be expected within 24 hours administration of test.  Patient may follow-up and result in her MyChart app.  Birdie voiced understanding of her medical evaluation and treatment plan. Each of her questions was answered to her expressed satisfaction.  Return precautions are given.  This patient was signed out  to oncoming ED provider, Burgess AmorJulie Idol,  PA-C at time of shift change.  Plan for discharge home after completion of potassium repletion.   [name] was evaluated in Emergency Department on 06/03/2020 for the symptoms described in the history of present illness. She was evaluated in the context of the global COVID-19 pandemic, which necessitated consideration that the patient might be at risk for infection with the SARS-CoV-2 virus that causes COVID-19. Institutional protocols and algorithms that pertain to the evaluation of patients at risk for COVID-19 are in a state of rapid change based on information released by regulatory bodies including the CDC and federal and state organizations. These policies and algorithms were followed during the patient's care in the ED.  This chart was dictated using voice recognition software, Dragon. Despite the best efforts of this provider to proofread and correct errors, errors may still occur which can change documentation meaning.   Final Clinical Impression(s) / ED Diagnoses Final diagnoses:  Diarrhea of presumed infectious origin    Rx / DC Orders ED Discharge Orders         Ordered    potassium chloride SA (KLOR-CON) 20 MEQ tablet  2 times daily        06/03/20 2054           Tonika Eden, Idelia SalmRebekah R, PA-C 06/03/20 2113    Terrilee FilesButler, Michael C, MD 06/04/20 1047

## 2020-06-04 LAB — SARS CORONAVIRUS 2 (TAT 6-24 HRS): SARS Coronavirus 2: NEGATIVE

## 2020-06-05 ENCOUNTER — Other Ambulatory Visit: Payer: Self-pay

## 2020-06-05 ENCOUNTER — Ambulatory Visit (INDEPENDENT_AMBULATORY_CARE_PROVIDER_SITE_OTHER): Payer: Self-pay | Admitting: Gastroenterology

## 2020-06-05 ENCOUNTER — Encounter (INDEPENDENT_AMBULATORY_CARE_PROVIDER_SITE_OTHER): Payer: Self-pay | Admitting: Gastroenterology

## 2020-06-05 DIAGNOSIS — R933 Abnormal findings on diagnostic imaging of other parts of digestive tract: Secondary | ICD-10-CM | POA: Insufficient documentation

## 2020-06-05 DIAGNOSIS — R197 Diarrhea, unspecified: Secondary | ICD-10-CM

## 2020-06-05 DIAGNOSIS — A09 Infectious gastroenteritis and colitis, unspecified: Secondary | ICD-10-CM

## 2020-06-05 DIAGNOSIS — R7989 Other specified abnormal findings of blood chemistry: Secondary | ICD-10-CM | POA: Insufficient documentation

## 2020-06-05 DIAGNOSIS — E876 Hypokalemia: Secondary | ICD-10-CM | POA: Insufficient documentation

## 2020-06-05 NOTE — Progress Notes (Signed)
Holly Parks, M.D. Gastroenterology & Hepatology [facility] [address] [place], [place] [address]  Primary Care Physician: Patient, No Pcp Per No address on file  I will communicate my assessment and recommendations to the referring MD via EMR.  Problems: 1. Acute diarrhea 2. ?  Intussusception versus imaging artifact  History of Present Illness: Holly Parks is a 30 y.o. female with no PMH, who presents for follow up after recent visit to the ER due to acute diarrhea.  The patient was last seen on 06/13/2017. At that time, the patient was presenting rectal bleeding and lower abdominal pain with diarrhea.  She was ordered to have CBC, ESR and CRP which were normal.  She was ordered as well to undergo a flexible sigmoidoscopy but the patient canceled this procedure as she had resolution of her symptoms.  Patient reports since last Sunday she has presented new onset of 10+ soft bowel movements and abdominal pain in her mid abdomen. She reports that she was feeling well and without any symptoms before Sunday. On Tuesday, she felt very lightheaded and noticed worsening abdominal distention, along with multiple watery bowel movements. She had a soft BM which she described as "creamy white" and since then she has had watery bowel movements. She has not been eating too much due to these symptoms. Only vomited once due to the pain but not anymore. The patient denies having any fever, chills, hematochezia, melena, hematemesis, abdominal distention,  jaundice, pruritus or weight loss.  Patient came to the ER 06/03/2020.  Labs show CMP with potassium of 2.7, rest of electrolytes were within normal limits, as well as renal function, AST and ALT were elevated 100 which 1 with alkaline phosphatase normal at 50 and total bilirubin 0.7.  CBC was normal we will cell count 6.8, hemoglobin 14 and platelets 164.  Apparently reviewed the CT abdomen and  pelvis with IV contrast that was performed which showed presence of thickening in the small bowel, questionable intussusception which could be related to artifact or peristalsis but there was thickening of some of the small bowel loops.  There was liquid stool throughout the colon.  Case was discussed with surgery who recommended outpatient follow-up.  She was given loperamide in the ER, has only taken it once. Patient has been taking oral potassium since discharged. She said today her BM was very soft in consistency but has not advanced her diet. She also reported having some chest pain today in the morning and some SOB. Denies any abdominal pain at the moment and the bloating has improved. She ate chicken noodle soup yesterday but had some transient bloating only.  Denies sick contacts.  Last EGD: never Last Colonoscopy: never  Past Medical History:History reviewed. No pertinent past medical history.  Past Surgical History: Past Surgical History:  Procedure Laterality Date  . PLANTAR'S WART EXCISION Bilateral    These were done when she was in [employer]  . WISDOM TOOTH EXTRACTION Bilateral    Patient was 30 years old when this was done.    Family History: Family History  Problem Relation Age of Onset  . Hernia Mother   . Other Mother   . Leukemia Father   . Healthy Brother     Social History: Social History   Tobacco Use  Smoking Status Never Smoker  Smokeless Tobacco Never Used   Social History   Substance and Sexual Activity  Alcohol Use No   Social History  Substance and Sexual Activity  Drug Use No    Allergies: Allergies  Allergen Reactions  . Penicillins   . Benadryl [Diphenhydramine Hcl] Rash    Medications: Current Outpatient Medications  Medication Sig Dispense Refill  . potassium chloride SA (KLOR-CON) 20 MEQ tablet Take 1 tablet (20 mEq total) by mouth 2 (two) times daily for 3 days. 6 tablet 0  . acyclovir (ZOVIRAX) 400 MG tablet TAKE 1 TABLET  4 TIMES A DAY FOR 10 DAYS (Patient not taking: Reported on 06/05/2020) 40 tablet 0  . azithromycin (ZITHROMAX Z-PAK) 250 MG tablet Take as directed (Patient not taking: Reported on 06/05/2020) 6 each 0  . benzonatate (TESSALON) 200 MG capsule Take 1 capsule (200 mg total) by mouth 3 (three) times daily as needed for cough. (Patient not taking: No sig reported) 30 capsule 0  . dicyclomine (BENTYL) 10 MG capsule Take 1 capsule (10 mg total) by mouth 3 (three) times daily before meals. (Patient not taking: Reported on 06/05/2020) 60 capsule 2  . valACYclovir (VALTREX) 500 MG tablet Take 500 mg by mouth daily. (Patient not taking: Reported on 06/05/2020)     No current facility-administered medications for this visit.    Review of Systems: GENERAL: negative for malaise, night sweats HEENT: No changes in hearing or vision, no nose bleeds or other nasal problems. NECK: Negative for lumps, goiter, pain and significant neck swelling RESPIRATORY: Negative for cough, wheezing CARDIOVASCULAR: Negative for chest pain, leg swelling, palpitations, orthopnea GI: SEE HPI MUSCULOSKELETAL: Negative for joint pain or swelling, back pain, and muscle pain. SKIN: Negative for lesions, rash PSYCH: Negative for sleep disturbance, mood disorder and recent psychosocial stressors. HEMATOLOGY Negative for prolonged bleeding, bruising easily, and swollen nodes. ENDOCRINE: Negative for cold or heat intolerance, polyuria, polydipsia and goiter. NEURO: negative for tremor, gait imbalance, syncope and seizures. The remainder of the review of systems is noncontributory.   Physical Exam: BP 116/83 (BP Location: Left Arm, Patient Position: Sitting, Cuff Size: Normal)   Pulse 73   Temp 97.6 F (36.4 C) (Oral)   Ht 5' 3"  (1.6 m)   Wt 132 lb (59.9 kg)   LMP  (LMP Unknown)   BMI 23.38 kg/m  GENERAL: The patient is AO x3, in no acute distress. HEENT: Head is normocephalic and atraumatic. EOMI are intact. Mouth is well  hydrated and without lesions. NECK: Supple. No masses LUNGS: Clear to auscultation. No presence of rhonchi/wheezing/rales. Adequate chest expansion HEART: RRR, normal s1 and s2. ABDOMEN: Soft, nontender, no guarding, no peritoneal signs, and nondistended. BS +. No masses. EXTREMITIES: Without any cyanosis, clubbing, rash, lesions or edema. NEUROLOGIC: AOx3, no focal motor deficit. SKIN: no jaundice, no rashes  Imaging/Labs: as above  I personally reviewed and interpreted the available labs, imaging and endoscopic files.  Impression and Plan: Holly Parks is a 30 y.o. female with no PMH, who presents for follow up after recent visit to the ER due to acute diarrhea.  The patient presented new onset episodes of diarrhea.  These episodes are different from her presentation 2 years ago and I consider they are unrelated and acute in nature.  It is very likely that the symptoms are secondary to an infectious etiology.  Due to this, I will order C. difficile testing and GI pathogen stool to explore this further.  Patient will perform this testing next week if her symptoms persist as she has presented some mild improvement in her symptoms today.  We will check celiac serologies today.  Patient can try advancing her diet, was advised to follow a BRAD diet for now.  Finally, given the findings on her CT, I advised the patient to have repeat CT enterography to delineate further the area of intussusception as this could have been a transient finding due to peristalsis or inflammation.  She will have this imaging performed in a month.  Finally, she will need to have a repeat CMP to evaluate if her elevated aminotransferases have improved (as suspected in a transient infectious insult) or if further work-up is warranted. Will check her K improvement with this test as well.  - Check CBC and celiac serologies - check c. Diff and GI path in a week if persistent symptoms - Schedule CT enterography with IV  contrast in 1 month - Start BRAD diet for now, advance diet as tolerated  All questions were answered.      Harvel Quale, MD Gastroenterology and Hepatology [facility]

## 2020-06-05 NOTE — Patient Instructions (Addendum)
Perform blood workup Perform stool workup in a week if persistent symptoms Schedule CT enterography with IV contrast in 1 month Try BRAD diet for now, advance diet as tolerated

## 2020-06-06 LAB — COMPREHENSIVE METABOLIC PANEL
AG Ratio: 1.7 (calc) (ref 1.0–2.5)
ALT: 95 U/L — ABNORMAL HIGH (ref 6–29)
AST: 34 U/L — ABNORMAL HIGH (ref 10–30)
Albumin: 4.3 g/dL (ref 3.6–5.1)
Alkaline phosphatase (APISO): 52 U/L (ref 31–125)
BUN/Creatinine Ratio: 9 (calc) (ref 6–22)
BUN: 6 mg/dL — ABNORMAL LOW (ref 7–25)
CO2: 27 mmol/L (ref 20–32)
Calcium: 8.8 mg/dL (ref 8.6–10.2)
Chloride: 106 mmol/L (ref 98–110)
Creat: 0.64 mg/dL (ref 0.50–1.10)
Globulin: 2.6 g/dL (calc) (ref 1.9–3.7)
Glucose, Bld: 79 mg/dL (ref 65–139)
Potassium: 3.8 mmol/L (ref 3.5–5.3)
Sodium: 142 mmol/L (ref 135–146)
Total Bilirubin: 0.3 mg/dL (ref 0.2–1.2)
Total Protein: 6.9 g/dL (ref 6.1–8.1)

## 2020-06-06 LAB — TISSUE TRANSGLUTAMINASE, IGA: (tTG) Ab, IgA: <1 U/mL

## 2020-06-06 LAB — IGA: Immunoglobulin A: 161 mg/dL (ref 47–310)

## 2020-06-09 ENCOUNTER — Ambulatory Visit: Payer: BLUE CROSS/BLUE SHIELD | Admitting: Nurse Practitioner

## 2020-06-10 LAB — C. DIFFICILE GDH AND TOXIN A/B
GDH ANTIGEN: NOT DETECTED
MICRO NUMBER:: 11421366
SPECIMEN QUALITY:: ADEQUATE
TOXIN A AND B: NOT DETECTED

## 2020-06-10 LAB — GASTROINTESTINAL PATHOGEN PANEL PCR
C. difficile Tox A/B, PCR: NOT DETECTED
Campylobacter, PCR: NOT DETECTED
Cryptosporidium, PCR: NOT DETECTED
E coli (ETEC) LT/ST PCR: NOT DETECTED
E coli (STEC) stx1/stx2, PCR: NOT DETECTED
E coli 0157, PCR: NOT DETECTED
Giardia lamblia, PCR: NOT DETECTED
Norovirus, PCR: NOT DETECTED
Rotavirus A, PCR: NOT DETECTED
Salmonella, PCR: NOT DETECTED
Shigella, PCR: NOT DETECTED

## 2020-06-12 ENCOUNTER — Encounter: Payer: Self-pay | Admitting: Nurse Practitioner

## 2020-06-12 ENCOUNTER — Ambulatory Visit (INDEPENDENT_AMBULATORY_CARE_PROVIDER_SITE_OTHER): Payer: Self-pay | Admitting: Nurse Practitioner

## 2020-06-12 ENCOUNTER — Other Ambulatory Visit: Payer: Self-pay

## 2020-06-12 VITALS — BP 116/79 | HR 86 | Temp 97.9°F | Resp 20 | Ht 63.0 in | Wt 129.0 lb

## 2020-06-12 DIAGNOSIS — E876 Hypokalemia: Secondary | ICD-10-CM

## 2020-06-12 DIAGNOSIS — J45909 Unspecified asthma, uncomplicated: Secondary | ICD-10-CM | POA: Insufficient documentation

## 2020-06-12 DIAGNOSIS — Z139 Encounter for screening, unspecified: Secondary | ICD-10-CM

## 2020-06-12 DIAGNOSIS — R933 Abnormal findings on diagnostic imaging of other parts of digestive tract: Secondary | ICD-10-CM

## 2020-06-12 DIAGNOSIS — Z0000 Encounter for general adult medical examination without abnormal findings: Secondary | ICD-10-CM | POA: Insufficient documentation

## 2020-06-12 DIAGNOSIS — Z7689 Persons encountering health services in other specified circumstances: Secondary | ICD-10-CM

## 2020-06-12 DIAGNOSIS — J4520 Mild intermittent asthma, uncomplicated: Secondary | ICD-10-CM

## 2020-06-12 NOTE — Progress Notes (Signed)
Acute Office Visit  Subjective:    Patient ID: Holly Parks, female    DOB: 1990/07/16, 30 y.o.   MRN: [id number]  Chief Complaint  Patient presents with  . New Patient (Initial Visit)    HPI Patient is in today for new patient visit. Was seeing Particia Nearing at [employer] [employer]. Last physical was over a year ago. She had labs drawn twice last week, and one of those was with Dr. Laural Golden.  No longer seeing GYN.  Initially, he was seen in ED. She had diarrheal illness and had intussusception on CT on 06/03/20.   At that time, she was found to have hypokalemia.  She states her K was 3.7 with her last set of labs.  Her K was replete with orally.  Past Medical History:  Diagnosis Date  . Asthma   . Back pain     Past Surgical History:  Procedure Laterality Date  . PLANTAR'S WART EXCISION Bilateral    These were done when she was in [employer]  . WISDOM TOOTH EXTRACTION Bilateral    Patient was 30 years old when this was done.    Family History  Problem Relation Age of Onset  . Hernia Mother   . Other Mother   . Leukemia Father   . Healthy Brother     Social History   Socioeconomic History  . Marital status: Single    Spouse name: Not on file  . Number of children: Not on file  . Years of education: Not on file  . Highest education level: Not on file  Occupational History  . Occupation: self employed- Programmer, multimedia  Tobacco Use  . Smoking status: Never Smoker  . Smokeless tobacco: Never Used  Vaping Use  . Vaping Use: Never used  Substance and Sexual Activity  . Alcohol use: No  . Drug use: No  . Sexual activity: Yes    Birth control/protection: Condom  Other Topics Concern  . Not on file  Social History Narrative  . Not on file   Social Determinants of Health   Financial Resource Strain: Not on file  Food Insecurity: Not on file  Transportation Needs: Not on file  Physical Activity: Not on file  Stress: Not on file  Social  Connections: Not on file  Intimate Partner Violence: Not on file    Outpatient Medications Prior to Visit  Medication Sig Dispense Refill  . dicyclomine (BENTYL) 10 MG capsule Take 1 capsule (10 mg total) by mouth 3 (three) times daily before meals. (Patient not taking: No sig reported) 60 capsule 2  . potassium chloride SA (KLOR-CON) 20 MEQ tablet Take 1 tablet (20 mEq total) by mouth 2 (two) times daily for 3 days. 6 tablet 0   No facility-administered medications prior to visit.    Allergies  Allergen Reactions  . Penicillins   . Benadryl [Diphenhydramine Hcl] Rash    Review of Systems  Constitutional: Negative.   Respiratory: Negative.   Cardiovascular: Negative.   Gastrointestinal: Positive for abdominal pain. Negative for diarrhea, nausea and vomiting.       Diarrhea resolved since symptoms sent her to the ED       Objective:    Physical Exam Constitutional:      Appearance: Normal appearance.  Cardiovascular:     Rate and Rhythm: Normal rate and regular rhythm.     Pulses: Normal pulses.     Heart sounds: Normal heart sounds.  Pulmonary:  Effort: Pulmonary effort is normal.     Breath sounds: Normal breath sounds.  Abdominal:     General: Abdomen is flat. There is no distension.     Palpations: Abdomen is soft. There is no mass.     Tenderness: There is abdominal tenderness. There is no guarding or rebound.     Hernia: No hernia is present.     Comments: Tenderness over bladder during palpation  Neurological:     Mental Status: She is alert.  Psychiatric:        Mood and Affect: Mood normal.        Behavior: Behavior normal.        Thought Content: Thought content normal.        Judgment: Judgment normal.     BP 116/79   Pulse 86   Temp 97.9 F (36.6 C)   Resp 20   Ht 5' 3"  (1.6 m)   Wt 129 lb (58.5 kg)   LMP  (LMP Unknown)   SpO2 97%   BMI 22.85 kg/m  Wt Readings from Last 3 Encounters:  06/12/20 129 lb (58.5 kg)  06/05/20 132 lb (59.9  kg)  06/03/20 132 lb (59.9 kg)    Health Maintenance Due  Topic Date Due  . Hepatitis C Screening  Never done  . COVID-19 Vaccine (1) Never done  . HIV Screening  Never done  . TETANUS/TDAP  Never done  . PAP-Cervical Cytology Screening  Never done  . PAP SMEAR-Modifier  Never done  . INFLUENZA VACCINE  12/23/2019    There are no preventive care reminders to display for this patient.   No results found for: TSH Lab Results  Component Value Date   WBC 6.8 06/03/2020   HGB 14.0 06/03/2020   HCT 41.6 06/03/2020   MCV 90.2 06/03/2020   PLT 164 06/03/2020   Lab Results  Component Value Date   NA 142 06/05/2020   K 3.8 06/05/2020   CO2 27 06/05/2020   GLUCOSE 79 06/05/2020   BUN 6 (L) 06/05/2020   CREATININE 0.64 06/05/2020   BILITOT 0.3 06/05/2020   ALKPHOS 50 06/03/2020   AST 34 (H) 06/05/2020   ALT 95 (H) 06/05/2020   PROT 6.9 06/05/2020   ALBUMIN 3.9 06/03/2020   CALCIUM 8.8 06/05/2020   ANIONGAP 9 06/03/2020   No results found for: CHOL No results found for: HDL No results found for: LDLCALC No results found for: TRIG No results found for: CHOLHDL No results found for: HGBA1C     Assessment & Plan:   Problem List Items Addressed This Visit      Respiratory   Asthma    -no issues today -no albuterol rx currently        Other   Abnormal CT scan, small bowel    -? intussuseption  -had diarrheal illness when this was found on CT -currently followed by Dr. Laural Golden -diarrhea has since resolved      Relevant Orders   CMP14+EGFR   Hypokalemia    -was repleted orally -will recheck with next set of labs -likely was caused by diarrhea which has since resolved      Relevant Orders   CMP14+EGFR   Encounter to establish care    -no recent PCP -will get HCV and HIV screening with next set of labs      Relevant Orders   CBC with Differential/Platelet   CMP14+EGFR   TSH + free T4   Lipid Panel With LDL/HDL Ratio  Other Visit Diagnoses     Screening due    -  Primary   Relevant Orders   HCV Ab w/Rflx to Verification   HIV Antibody (routine testing w rflx)   TSH + free T4       No orders of the defined types were placed in this encounter.    Noreene Larsson, NP

## 2020-06-12 NOTE — Assessment & Plan Note (Addendum)
-?   intussuseption  -had diarrheal illness when this was found on CT -currently followed by Dr. Karilyn Cota -diarrhea has since resolved

## 2020-06-12 NOTE — Patient Instructions (Signed)
It was great meeting you today. We will meet back up in 2 weeks. Please get fasting labs 2-3 days prior to your appointment.

## 2020-06-12 NOTE — Assessment & Plan Note (Signed)
-  no recent PCP -will get HCV and HIV screening with next set of labs

## 2020-06-12 NOTE — Assessment & Plan Note (Signed)
-  was repleted orally -will recheck with next set of labs -likely was caused by diarrhea which has since resolved

## 2020-06-12 NOTE — Assessment & Plan Note (Signed)
-  no issues today -no albuterol rx currently

## 2020-06-16 ENCOUNTER — Encounter (INDEPENDENT_AMBULATORY_CARE_PROVIDER_SITE_OTHER): Payer: Self-pay

## 2020-06-24 LAB — CBC WITH DIFFERENTIAL/PLATELET
Basophils Absolute: 0 10*3/uL (ref 0.0–0.2)
Basos: 1 %
EOS (ABSOLUTE): 0.1 10*3/uL (ref 0.0–0.4)
Eos: 2 %
Hematocrit: 39.5 % (ref 34.0–46.6)
Hemoglobin: 13.3 g/dL (ref 11.1–15.9)
Immature Grans (Abs): 0 10*3/uL (ref 0.0–0.1)
Immature Granulocytes: 0 %
Lymphocytes Absolute: 2 10*3/uL (ref 0.7–3.1)
Lymphs: 35 %
MCH: 29.8 pg (ref 26.6–33.0)
MCHC: 33.7 g/dL (ref 31.5–35.7)
MCV: 88 fL (ref 79–97)
Monocytes Absolute: 0.5 10*3/uL (ref 0.1–0.9)
Monocytes: 8 %
Neutrophils Absolute: 3.1 10*3/uL (ref 1.4–7.0)
Neutrophils: 54 %
Platelets: 259 10*3/uL (ref 150–450)
RBC: 4.47 x10E6/uL (ref 3.77–5.28)
RDW: 11.6 % — ABNORMAL LOW (ref 11.7–15.4)
WBC: 5.7 10*3/uL (ref 3.4–10.8)

## 2020-06-24 LAB — LIPID PANEL WITH LDL/HDL RATIO
Cholesterol, Total: 182 mg/dL (ref 100–199)
HDL: 63 mg/dL (ref >39)
LDL Chol Calc (NIH): 109 mg/dL — ABNORMAL HIGH (ref 0–99)
LDL/HDL Ratio: 1.7 ratio (ref 0.0–3.2)
Triglycerides: 49 mg/dL (ref 0–149)
VLDL Cholesterol Cal: 10 mg/dL (ref 5–40)

## 2020-06-24 LAB — TSH+FREE T4
Free T4: 1.38 ng/dL (ref 0.82–1.77)
TSH: 2.5 u[IU]/mL (ref 0.450–4.500)

## 2020-06-24 LAB — CMP14+EGFR
ALT: 14 IU/L (ref 0–32)
AST: 17 IU/L (ref 0–40)
Albumin/Globulin Ratio: 1.6 (ref 1.2–2.2)
Albumin: 4.5 g/dL (ref 3.9–5.0)
Alkaline Phosphatase: 66 IU/L (ref 44–121)
BUN/Creatinine Ratio: 23 (ref 9–23)
BUN: 18 mg/dL (ref 6–20)
Bilirubin Total: 0.4 mg/dL (ref 0.0–1.2)
CO2: 24 mmol/L (ref 20–29)
Calcium: 9.3 mg/dL (ref 8.7–10.2)
Chloride: 100 mmol/L (ref 96–106)
Creatinine, Ser: 0.79 mg/dL (ref 0.57–1.00)
GFR calc Af Amer: 119 mL/min/{1.73_m2} (ref >59)
GFR calc non Af Amer: 103 mL/min/{1.73_m2} (ref >59)
Globulin, Total: 2.9 g/dL (ref 1.5–4.5)
Glucose: 82 mg/dL (ref 65–99)
Potassium: 3.8 mmol/L (ref 3.5–5.2)
Sodium: 140 mmol/L (ref 134–144)
Total Protein: 7.4 g/dL (ref 6.0–8.5)

## 2020-06-24 LAB — HIV ANTIBODY (ROUTINE TESTING W REFLEX): HIV Screen 4th Generation wRfx: NONREACTIVE

## 2020-06-24 LAB — HCV INTERPRETATION

## 2020-06-24 LAB — HCV AB W/RFLX TO VERIFICATION: HCV Ab: <0.1 s/co ratio (ref 0.0–0.9)

## 2020-06-26 ENCOUNTER — Ambulatory Visit (INDEPENDENT_AMBULATORY_CARE_PROVIDER_SITE_OTHER): Payer: Self-pay | Admitting: Nurse Practitioner

## 2020-06-26 ENCOUNTER — Other Ambulatory Visit: Payer: Self-pay

## 2020-06-26 ENCOUNTER — Encounter: Payer: Self-pay | Admitting: Nurse Practitioner

## 2020-06-26 DIAGNOSIS — R7989 Other specified abnormal findings of blood chemistry: Secondary | ICD-10-CM

## 2020-06-26 DIAGNOSIS — R933 Abnormal findings on diagnostic imaging of other parts of digestive tract: Secondary | ICD-10-CM

## 2020-06-26 DIAGNOSIS — J4520 Mild intermittent asthma, uncomplicated: Secondary | ICD-10-CM

## 2020-06-26 DIAGNOSIS — N946 Dysmenorrhea, unspecified: Secondary | ICD-10-CM | POA: Insufficient documentation

## 2020-06-26 DIAGNOSIS — Z0000 Encounter for general adult medical examination without abnormal findings: Secondary | ICD-10-CM

## 2020-06-26 NOTE — Assessment & Plan Note (Signed)
-  no issues today -no need for albuterol rx currently

## 2020-06-26 NOTE — Assessment & Plan Note (Signed)
-  has left abdominal, hip, and leg pain that she is associating with her menstrual cycles -she has history of passing large volume "clots" off and on for about 6 years -refer to GYN

## 2020-06-26 NOTE — Assessment & Plan Note (Signed)
-  resolved on current labs Lab Results  Component Value Date   ALT 14 06/23/2020   AST 17 06/23/2020   ALKPHOS 66 06/23/2020   BILITOT 0.4 06/23/2020

## 2020-06-26 NOTE — Patient Instructions (Signed)
It was great seeing you again today. Your labs looked great.  I sent in a referral to GYN as I think they will be more likely to help resolve your cyclical pain issues.  We will meet back up in 1 year for a physical unless you need anything between now and then.

## 2020-06-26 NOTE — Assessment & Plan Note (Signed)
-  followed by Dr. Karilyn Cota for intussusception  -still has mild lower abdominal pain on palpation today

## 2020-06-26 NOTE — Progress Notes (Signed)
Established Patient Office Visit  Subjective:  Patient ID: Holly Parks, female    DOB: 1990/09/09  Age: 30 y.o. MRN: [id number]  CC:  Chief Complaint  Patient presents with  . Follow-up    Go over labs     HPI [name] presents for physical exam. She states that she has had abnormally large bloody vaginal discharge since 2016.  It has come and gone since it first started.  For the last sevearl months she has started having menstrual cycle-related pain to her left hip, left leg, and LLQ of her abdomen.  Past Medical History:  Diagnosis Date  . Asthma   . Back pain     Past Surgical History:  Procedure Laterality Date  . PLANTAR'S WART EXCISION Bilateral    These were done when she was in [employer]  . WISDOM TOOTH EXTRACTION Bilateral    Patient was 30 years old when this was done.    Family History  Problem Relation Age of Onset  . Hernia Mother   . Other Mother   . Leukemia Father   . Healthy Brother     Social History   Socioeconomic History  . Marital status: Single    Spouse name: Not on file  . Number of children: Not on file  . Years of education: Not on file  . Highest education level: Not on file  Occupational History  . Occupation: self employed- Herbalist  Tobacco Use  . Smoking status: Never Smoker  . Smokeless tobacco: Never Used  Vaping Use  . Vaping Use: Never used  Substance and Sexual Activity  . Alcohol use: No  . Drug use: No  . Sexual activity: Yes    Birth control/protection: Condom  Other Topics Concern  . Not on file  Social History Narrative  . Not on file   Social Determinants of Health   Financial Resource Strain: Not on file  Food Insecurity: Not on file  Transportation Needs: Not on file  Physical Activity: Not on file  Stress: Not on file  Social Connections: Not on file  Intimate Partner Violence: Not on file    Outpatient Medications Prior to Visit  Medication Sig Dispense Refill  .  dicyclomine (BENTYL) 10 MG capsule Take 1 capsule (10 mg total) by mouth 3 (three) times daily before meals. 60 capsule 2  . potassium chloride SA (KLOR-CON) 20 MEQ tablet Take 1 tablet (20 mEq total) by mouth 2 (two) times daily for 3 days. 6 tablet 0   No facility-administered medications prior to visit.    Allergies  Allergen Reactions  . Penicillins   . Benadryl [Diphenhydramine Hcl] Rash    ROS Review of Systems  Constitutional: Negative.   HENT: Negative.   Eyes: Negative.   Respiratory: Negative.   Cardiovascular: Negative.   Gastrointestinal: Negative.   Endocrine: Negative.   Genitourinary: Positive for menstrual problem and pelvic pain. Negative for vaginal bleeding, vaginal discharge and vaginal pain.       Menstrual pain that has been getting progressively worse  Musculoskeletal: Negative.   Skin: Negative.   Allergic/Immunologic: Negative.   Neurological: Negative.   Hematological: Negative.   Psychiatric/Behavioral: Negative.       Objective:    Physical Exam Constitutional:      Appearance: Normal appearance.  HENT:     Head: Normocephalic and atraumatic.     Right Ear: Tympanic membrane, ear canal and external ear normal.     Left  Ear: Tympanic membrane, ear canal and external ear normal.     Nose: Nose normal.     Mouth/Throat:     Mouth: Mucous membranes are moist.     Pharynx: Oropharynx is clear.  Eyes:     Extraocular Movements: Extraocular movements intact.     Conjunctiva/sclera: Conjunctivae normal.     Pupils: Pupils are equal, round, and reactive to light.  Cardiovascular:     Rate and Rhythm: Normal rate and regular rhythm.     Pulses: Normal pulses.     Heart sounds: Normal heart sounds.  Pulmonary:     Effort: Pulmonary effort is normal.     Breath sounds: Normal breath sounds.  Abdominal:     General: Abdomen is flat. Bowel sounds are normal.     Palpations: Abdomen is soft.  Musculoskeletal:        General: Normal range of  motion.     Cervical back: Normal range of motion and neck supple.  Skin:    General: Skin is warm.     Capillary Refill: Capillary refill takes less than 2 seconds.  Neurological:     General: No focal deficit present.     Mental Status: She is alert and oriented to person, place, and time.     Cranial Nerves: No cranial nerve deficit.     Sensory: No sensory deficit.     Motor: No weakness.     Coordination: Coordination normal.     Gait: Gait normal.  Psychiatric:        Mood and Affect: Mood normal.        Behavior: Behavior normal.        Thought Content: Thought content normal.        Judgment: Judgment normal.     BP 113/75   Pulse 90   Temp 98.6 F (37 C)   Resp 20   Ht 5\' 3"  (1.6 m)   Wt 128 lb (58.1 kg)   LMP  (LMP Unknown)   SpO2 96%   BMI 22.67 kg/m  Wt Readings from Last 3 Encounters:  06/26/20 128 lb (58.1 kg)  06/12/20 129 lb (58.5 kg)  06/05/20 132 lb (59.9 kg)     Health Maintenance Due  Topic Date Due  . PAP-Cervical Cytology Screening  Never done  . PAP SMEAR-Modifier  Never done    There are no preventive care reminders to display for this patient.  Lab Results  Component Value Date   TSH 2.500 06/23/2020   Lab Results  Component Value Date   WBC 5.7 06/23/2020   HGB 13.3 06/23/2020   HCT 39.5 06/23/2020   MCV 88 06/23/2020   PLT 259 06/23/2020   Lab Results  Component Value Date   NA 140 06/23/2020   K 3.8 06/23/2020   CO2 24 06/23/2020   GLUCOSE 82 06/23/2020   BUN 18 06/23/2020   CREATININE 0.79 06/23/2020   BILITOT 0.4 06/23/2020   ALKPHOS 66 06/23/2020   AST 17 06/23/2020   ALT 14 06/23/2020   PROT 7.4 06/23/2020   ALBUMIN 4.5 06/23/2020   CALCIUM 9.3 06/23/2020   ANIONGAP 9 06/03/2020   Lab Results  Component Value Date   CHOL 182 06/23/2020   Lab Results  Component Value Date   HDL 63 06/23/2020   Lab Results  Component Value Date   LDLCALC 109 (H) 06/23/2020   Lab Results  Component Value Date   TRIG  49 06/23/2020   No results found  for: CHOLHDL No results found for: OHYW7P    Assessment & Plan:   Problem List Items Addressed This Visit      Respiratory   Asthma    -no issues today -no need for albuterol rx currently        Genitourinary   Menstrual pain    -has left abdominal, hip, and leg pain that she is associating with her menstrual cycles -she has history of passing large volume "clots" off and on for about 6 years -refer to GYN      Relevant Orders   Ambulatory referral to Gynecology     Other   Abnormal CT scan, small bowel    -followed by Dr. Karilyn Cota for intussusception  -still has mild lower abdominal pain on palpation today      Elevated LFTs    -resolved on current labs Lab Results  Component Value Date   ALT 14 06/23/2020   AST 17 06/23/2020   ALKPHOS 66 06/23/2020   BILITOT 0.4 06/23/2020         Preventative health care    -refuses some immunizations -we discussed PAP today, but she is having other menstrual-related pain, so will defer this to GYN         No orders of the defined types were placed in this encounter.   Follow-up: Return in about 1 year (around 06/26/2021) for Comprehensive Physical Exam.    Heather Roberts, NP

## 2020-06-26 NOTE — Assessment & Plan Note (Signed)
-  refuses some immunizations -we discussed PAP today, but she is having other menstrual-related pain, so will defer this to GYN

## 2020-07-11 ENCOUNTER — Other Ambulatory Visit: Payer: Self-pay

## 2020-07-11 ENCOUNTER — Encounter (HOSPITAL_COMMUNITY): Payer: Self-pay

## 2020-07-11 ENCOUNTER — Emergency Department (HOSPITAL_COMMUNITY): Payer: Self-pay

## 2020-07-11 ENCOUNTER — Emergency Department (HOSPITAL_COMMUNITY)
Admission: EM | Admit: 2020-07-11 | Discharge: 2020-07-11 | Disposition: A | Discharge status: Home / Self Care | Payer: Self-pay | Attending: Emergency Medicine | Admitting: Emergency Medicine

## 2020-07-11 DIAGNOSIS — R109 Unspecified abdominal pain: Secondary | ICD-10-CM | POA: Insufficient documentation

## 2020-07-11 DIAGNOSIS — R55 Syncope and collapse: Secondary | ICD-10-CM | POA: Insufficient documentation

## 2020-07-11 DIAGNOSIS — J45909 Unspecified asthma, uncomplicated: Secondary | ICD-10-CM | POA: Insufficient documentation

## 2020-07-11 DIAGNOSIS — R197 Diarrhea, unspecified: Secondary | ICD-10-CM | POA: Insufficient documentation

## 2020-07-11 LAB — CBC
HCT: 41 % (ref 36.0–46.0)
Hemoglobin: 13.5 g/dL (ref 12.0–15.0)
MCH: 30.3 pg (ref 26.0–34.0)
MCHC: 32.9 g/dL (ref 30.0–36.0)
MCV: 92.1 fL (ref 80.0–100.0)
Platelets: 245 10*3/uL (ref 150–400)
RBC: 4.45 MIL/uL (ref 3.87–5.11)
RDW: 12.2 % (ref 11.5–15.5)
WBC: 6.8 10*3/uL (ref 4.0–10.5)
nRBC: 0 % (ref 0.0–0.2)

## 2020-07-11 LAB — URINALYSIS, ROUTINE W REFLEX MICROSCOPIC
Bilirubin Urine: NEGATIVE
Glucose, UA: NEGATIVE mg/dL
Ketones, ur: NEGATIVE mg/dL
Leukocytes,Ua: NEGATIVE
Nitrite: NEGATIVE
Protein, ur: 30 mg/dL — AB
RBC / HPF: >50 RBC/hpf — ABNORMAL HIGH (ref 0–5)
Specific Gravity, Urine: 1.017 (ref 1.005–1.030)
pH: 5 (ref 5.0–8.0)

## 2020-07-11 LAB — BASIC METABOLIC PANEL
Anion gap: 4 — ABNORMAL LOW (ref 5–15)
BUN: 19 mg/dL (ref 6–20)
CO2: 26 mmol/L (ref 22–32)
Calcium: 8.9 mg/dL (ref 8.9–10.3)
Chloride: 107 mmol/L (ref 98–111)
Creatinine, Ser: 0.69 mg/dL (ref 0.44–1.00)
GFR, Estimated: >60 mL/min (ref >60)
Glucose, Bld: 100 mg/dL — ABNORMAL HIGH (ref 70–99)
Potassium: 3.7 mmol/L (ref 3.5–5.1)
Sodium: 137 mmol/L (ref 135–145)

## 2020-07-11 LAB — CBG MONITORING, ED: Glucose-Capillary: 93 mg/dL (ref 70–99)

## 2020-07-11 LAB — POC URINE PREG, ED: Preg Test, Ur: NEGATIVE

## 2020-07-11 MED ORDER — IOHEXOL 300 MG/ML  SOLN
75.0000 mL | Freq: Once | INTRAMUSCULAR | Status: AC | PRN
  Administered 2020-07-11: 75 mL via INTRAVENOUS

## 2020-07-11 MED ORDER — SODIUM CHLORIDE 0.9 % IV BOLUS
1000.0000 mL | Freq: Once | INTRAVENOUS | Status: AC
  Administered 2020-07-11: 1000 mL via INTRAVENOUS

## 2020-07-11 NOTE — ED Notes (Signed)
Pt ambulated to the bathroom with no issues. Urine sample provided by patient.

## 2020-07-11 NOTE — ED Triage Notes (Signed)
Pt reports that her stomach began and then she passed out at work. States she has been seen here before this . States she has diarrhea after she passed out

## 2020-07-11 NOTE — ED Provider Notes (Signed)
[facility] EMERGENCY DEPARTMENT Provider Note   CSN: [id number] Arrival date & time: 07/11/20  9326     History Chief Complaint  Patient presents with  . Loss of Consciousness    Holly Parks is a 30 y.o. female.  HPI   29 year old female presents the emergency department with concern for stomach pain/diarrhea and syncope.  Patient states when she woke up this morning she had an upset stomach.  When she got to work she had sharper abdominal/back pain that then resulted in her passing out.  When she woke back up she returned to baseline but had a large bowel movement. Currently she is pain free and feels back to baseline. Denies any preceding chest pain, shortness of breath or dizziness.  She has no chest pain or shortness of breath right now.  This has happened to her once before, many times in the past couple years, more recently a month ago.  Last month she had the same abdominal pain pattern with syncope and diarrhea.  She had a work-up at that time, CT the abdomen pelvis was concerning/questionable for intussusception.  Surgery was consulted and she was plan for outpatient follow-up.  She seen the GI doctor who has her scheduled for repeat CT of the abdomen pelvis with IV contrast soon.  Patient states she gets these sharp abdominal pains usually in the beginning of her menstrual cycle, she is currently on her menstrual cycle.  Does not believe that she is pregnant.  Currently denies any lower pelvic pain.  No recent fever/nausea/vomiting.  Past Medical History:  Diagnosis Date  . Asthma   . Back pain     Patient Active Problem List   Diagnosis Date Noted  . Menstrual pain 06/26/2020  . Preventative health care 06/12/2020  . Asthma 06/12/2020  . Abnormal CT scan, small bowel 06/05/2020  . Elevated LFTs 06/05/2020  . Injury due to laser 12/21/2016    Past Surgical History:  Procedure Laterality Date  . PLANTAR'S WART EXCISION Bilateral    These were done when she was in  [employer]  . WISDOM TOOTH EXTRACTION Bilateral    Patient was 30 years old when this was done.     OB History   No obstetric history on file.     Family History  Problem Relation Age of Onset  . Hernia Mother   . Other Mother   . Leukemia Father   . Healthy Brother     Social History   Tobacco Use  . Smoking status: Never Smoker  . Smokeless tobacco: Never Used  Vaping Use  . Vaping Use: Never used  Substance Use Topics  . Alcohol use: No  . Drug use: No    Home Medications Prior to Admission medications   Medication Sig Start Date End Date Taking? Authorizing Provider  cholecalciferol (VITAMIN D3) 25 MCG (1000 UNIT) tablet Take 1,000 Units by mouth daily.   Yes [provider]  Multiple Vitamin (MULTIVITAMIN WITH MINERALS) TABS tablet Take 1 tablet by mouth daily.   Yes [provider]  vitamin C (ASCORBIC ACID) 500 MG tablet Take 500 mg by mouth daily.   Yes [provider]  dicyclomine (BENTYL) 10 MG capsule Take 1 capsule (10 mg total) by mouth 3 (three) times daily before meals. Patient not taking: Reported on 07/11/2020 06/13/17   Malissa Hippo, MD    Allergies    Penicillins and Benadryl [diphenhydramine hcl]  Review of Systems   Review of Systems  Constitutional: Negative for chills and fever.  HENT: Negative for congestion.   Eyes: Negative for visual disturbance.  Respiratory: Negative for cough, chest tightness and shortness of breath.   Cardiovascular: Negative for chest pain, palpitations and leg swelling.  Gastrointestinal: Positive for abdominal pain and diarrhea. Negative for vomiting.  Genitourinary: Positive for vaginal bleeding. Negative for dysuria, pelvic pain, vaginal discharge and vaginal pain.  Musculoskeletal: Negative for back pain.  Skin: Negative for rash.  Neurological: Positive for syncope. Negative for headaches.    Physical Exam Updated Vital Signs BP 118/86   Pulse 96   Temp 98.2 F (36.8 C)  (Oral)   Resp 15   Ht 5\' 3"  (1.6 m)   Wt 58.5 kg   LMP 07/10/2020   SpO2 100%   BMI 22.85 kg/m   Physical Exam Vitals and nursing note reviewed.  Constitutional:      Appearance: Normal appearance.  HENT:     Head: Normocephalic.     Mouth/Throat:     Mouth: Mucous membranes are moist.  Cardiovascular:     Rate and Rhythm: Normal rate.  Pulmonary:     Effort: Pulmonary effort is normal. No respiratory distress.  Abdominal:     General: There is no distension.     Palpations: Abdomen is soft. There is no mass.     Tenderness: There is no abdominal tenderness. There is no guarding or rebound.     Hernia: No hernia is present.  Skin:    General: Skin is warm.  Neurological:     Mental Status: She is alert and oriented to person, place, and time. Mental status is at baseline.  Psychiatric:        Mood and Affect: Mood normal.     ED Results / Procedures / Treatments   Labs (all labs ordered are listed, but only abnormal results are displayed) Labs Reviewed  BASIC METABOLIC PANEL - Abnormal; Notable for the following components:      Result Value   Glucose, Bld 100 (*)    Anion gap 4 (*)    All other components within normal limits  CBC  URINALYSIS, ROUTINE W REFLEX MICROSCOPIC  CBG MONITORING, ED  POC URINE PREG, ED    EKG EKG Interpretation  Date/Time:  Friday July 11 2020 09:27:47 EST Ventricular Rate:  97 PR Interval:    QRS Duration: 91 QT Interval:  347 QTC Calculation: 441 R Axis:   82 Text Interpretation: Sinus rhythm RSR' in V1 or V2, right VCD or RVH NSR, no previous to compare to, borderline RSR in v1-v2 Confirmed by [id number] [phone]) on 07/11/2020 9:36:35 AM   Radiology No results found.  Procedures Procedures   Medications Ordered in ED Medications - No data to display  ED Course  I have reviewed the triage vital signs and the nursing notes.  Pertinent labs & imaging results that were available during my care of the patient  were reviewed by me and considered in my medical decision making (see chart for details).    MDM Rules/Calculators/A&P                          30 year old female presents the emergency department after an episode of abdominal pain, syncope and diarrhea. Patient has had these symptoms before, stemming back for years. More recently it has been monthly with her past 2 menstrual cycles. Last time when she was here she was evaluated, CAT scan showed an area  of possible intussusception. Surgery recommended outpatient follow-up and she is currently following with GI. Patient states the same thing happens every time, she develops abdominal/back pain that is so severe it causes her to pass out, she then has an episode of diarrhea and returns back to baseline. Currently she feels well and is asymptomatic, she is frustrated but otherwise pleasant. There is no prodrome, she has no chest pain, lightheadedness, palpitations, shortness of breath. Low suspicion for cardiac etiology of passing out. She is PERC negative, low suspicion for PE. Orthostatics are normal. CT of the abdomen pelvis today shows no finding of intussusception or abnormality. Blood work is reassuring, she is well-appearing, ambulatory, eating and drinking. Pregnancy test is negative. Patient could be having intermittent colon spasm/intussusception that causes her severe pain with vasovagal. Again I do not appreciate an acute cardiac/pulmonary component of this especially with her experiencing this for multiple years. She already has outpatient follow-up with GI and OB/GYN. I have also encouraged possible cardiology follow-up if she has no resolution or improvement with these 2 specialties. Patient will be discharged and treated as an outpatient.  Discharge plan and strict return to ED precautions discussed, patient verbalizes understanding and agreement.  Final Clinical Impression(s) / ED Diagnoses Final diagnoses:  None    Rx / DC Orders ED  Discharge Orders    None       Rozelle Logan, DO 07/11/20 1559

## 2020-07-11 NOTE — Discharge Instructions (Signed)
You have been seen and discharged from the emergency department.  Follow-up with your primary provider, gastroenterologist and OB/GYN for reevaluation. Take home medications as prescribed, stay well-hydrated. If you have any worsening symptoms or further concerns for health please return to an emergency department for further evaluation.

## 2020-07-17 ENCOUNTER — Ambulatory Visit (HOSPITAL_COMMUNITY): Payer: Self-pay

## 2020-08-04 DIAGNOSIS — N809 Endometriosis, unspecified: Secondary | ICD-10-CM

## 2020-08-04 HISTORY — DX: Endometriosis, unspecified: N80.9

## 2020-08-06 ENCOUNTER — Telehealth: Payer: Self-pay

## 2020-08-06 NOTE — Telephone Encounter (Signed)
NOTES ON FILE FROM DR [facility] [phone],SENT REFERRAL TO SCHEDULING

## 2020-09-15 ENCOUNTER — Other Ambulatory Visit: Payer: Self-pay | Admitting: Cardiology

## 2020-09-15 ENCOUNTER — Encounter: Payer: Self-pay | Admitting: Cardiology

## 2020-09-15 ENCOUNTER — Ambulatory Visit (INDEPENDENT_AMBULATORY_CARE_PROVIDER_SITE_OTHER): Payer: Self-pay

## 2020-09-15 ENCOUNTER — Other Ambulatory Visit: Payer: Self-pay

## 2020-09-15 ENCOUNTER — Ambulatory Visit (INDEPENDENT_AMBULATORY_CARE_PROVIDER_SITE_OTHER): Payer: Self-pay | Admitting: Cardiology

## 2020-09-15 VITALS — BP 114/68 | HR 94 | Ht 63.0 in | Wt 127.8 lb

## 2020-09-15 DIAGNOSIS — R002 Palpitations: Secondary | ICD-10-CM

## 2020-09-15 DIAGNOSIS — R0602 Shortness of breath: Secondary | ICD-10-CM

## 2020-09-15 DIAGNOSIS — R0789 Other chest pain: Secondary | ICD-10-CM

## 2020-09-15 NOTE — Patient Instructions (Signed)
Medication Instructions:   *If you need a refill on your cardiac medications before your next appointment, please call your pharmacy*   Lab Work: None today  If you have labs (blood work) drawn today and your tests are completely normal, you will receive your results only by: Marland Kitchen MyChart Message (if you have MyChart) OR . A paper copy in the mail If you have any lab test that is abnormal or we need to change your treatment, we will call you to review the results.   Testing/Procedures: Your physician has requested that you have an echocardiogram. Echocardiography is a painless test that uses sound waves to create images of your heart. It provides your doctor with information about the size and shape of your heart and how well your heart's chambers and valves are working. This procedure takes approximately one hour. There are no restrictions for this procedure.  ZIO XT- Long Term Monitor Instructions   Your physician has requested you wear your ZIO patch monitor___7____days.   This is a single patch monitor.  [employer] supplies one patch monitor per enrollment.  Additional stickers are not available.   Please do not apply patch if you will be having a Nuclear Stress Test, Echocardiogram, Cardiac CT, MRI, or Chest Xray during the time frame you would be wearing the monitor. The patch cannot be worn during these tests.  You cannot remove and re-apply the ZIO XT patch monitor.       Do not shower for the first 24 hours.  You may shower after the first 24 hours.   Press button if you feel a symptom. You will hear a small click.  Record Date, Time and Symptom in the Patient Log Book.   When you are ready to remove patch, follow instructions on last 2 pages of Patient Log Book.  Stick patch monitor onto last page of Patient Log Book.   Place Patient Log Book in [place] box.  Use locking tab on box and tape box closed securely.  The [employer] and [employer] has [employer] on it.  Please place in  mailbox as soon as possible.  Your physician should have your test results approximately 7 days after the monitor has been mailed back to [facility].   Call [facility] Customer Care at [phone] if you have questions regarding your ZIO XT patch monitor.  Call them immediately if you see an orange light blinking on your monitor.   If your monitor falls off in less than 4 days contact our Monitor department at [phone].  If your monitor becomes loose or falls off after 4 days call [employer] at [phone] for suggestions on securing your monitor.     Follow-Up: At [facility], you and your health needs are our priority.  As part of our continuing mission to provide you with exceptional heart care, we have created designated Provider Care Teams.  These Care Teams include your primary Cardiologist (physician) and Advanced Practice Providers (APPs -  Physician Assistants and Nurse Practitioners) who all work together to provide you with the care you need, when you need it.  We recommend signing up for the patient portal called "MyChart".  Sign up information is provided on this After Visit Summary.  MyChart is used to connect with patients for Virtual Visits (Telemedicine).  Patients are able to view lab/test results, encounter notes, upcoming appointments, etc.  Non-urgent messages can be sent to your provider as well.   To learn more about what you can do  with MyChart, go to [email or web address].    Your next appointment:  We will call you with test results.

## 2020-09-15 NOTE — Progress Notes (Signed)
Cardiology Office Note  Date: 09/15/2020   ID: DYLANN LAYNE, DOB 1990-10-09, MRN [id number]  PCP:  Heather Roberts, NP  Cardiologist:  Nona Dell, MD Electrophysiologist:  None   Chief Complaint  Patient presents with  . Palpitations    History of Present Illness: Holly Parks is a 30 y.o. female referred for cardiology consultation by Dr. Langston Masker with reported history of palpitations.  She works as a Lawyer and also at a daycare.  She tells me that she feels a sense of her heart rate speeding up and then within a period of minutes slowing down, this has been sporadic, usually no more than twice in a week.  Sometimes feels lightheaded.  Unrelated to the symptoms she also experiences a feeling of shortness of breath and tightness in her chest, often tends to happen in the evenings without provocation.  She states that she feels like she most likely has had COVID-19 on 3 occasions based on symptoms, most recently of a little over a month ago.  She has not been vaccinated.  Did not require hospitalization.  The above symptoms have been more noticeable since the most recent event.  She also reports having trouble with abdominal pain and left hip pain earlier in the year, was seen in the ER back in February with CT imaging showing small bowel intussusception, this spontaneously resolved however.  She reports having had 2 episodes of syncope in the setting of pain, question possible vasovagal etiology.  Orthostatic measurements were made today.  Supine blood pressure 90/60 with heart rate 64, seated blood pressure 102/66 with heart rate 78, and standing blood pressure 88/66 with heart rate 82.  I reviewed her lab work from January and February.   Past Medical History:  Diagnosis Date  . Asthma   . Back pain   . Endometriosis 08/04/2020  . Migraine     Past Surgical History:  Procedure Laterality Date  . PLANTAR'S WART EXCISION Bilateral    These were done when she was in  [employer]  . WISDOM TOOTH EXTRACTION Bilateral    Patient was 30 years old when this was done.    Current Outpatient Medications  Medication Sig Dispense Refill  . cholecalciferol (VITAMIN D3) 25 MCG (1000 UNIT) tablet Take 1,000 Units by mouth daily.    . Multiple Vitamin (MULTIVITAMIN WITH MINERALS) TABS tablet Take 1 tablet by mouth daily.    . Turmeric (QC TUMERIC COMPLEX PO) Take by mouth.    . vitamin B-12 (CYANOCOBALAMIN) 500 MCG tablet Take 500 mcg by mouth daily.    . vitamin C (ASCORBIC ACID) 500 MG tablet Take 500 mg by mouth daily.     No current facility-administered medications for this visit.   Allergies:  Penicillins and Benadryl [diphenhydramine hcl]   Social History: The patient  reports that she has never smoked. She has never used smokeless tobacco. She reports current alcohol use. She reports that she does not use drugs.   Family History: The patient's family history includes Hernia in her mother; Leukemia in her father; Other in her mother.   ROS: No orthopnea or PND.  No leg swelling.  Physical Exam: VS:  BP 114/68   Pulse 94   Ht 5\' 3"  (1.6 m)   Wt 127 lb 12.8 oz (58 kg)   SpO2 99%   BMI 22.64 kg/m , BMI Body mass index is 22.64 kg/m.  Wt Readings from Last 3 Encounters:  09/15/20 127 lb 12.8  oz (58 kg)  07/11/20 129 lb (58.5 kg)  06/26/20 128 lb (58.1 kg)    General: Patient appears comfortable at rest. HEENT: Conjunctiva and lids normal, wearing a mask. Neck: Supple, no elevated JVP or carotid bruits, no thyromegaly. Lungs: Clear to auscultation, nonlabored breathing at rest. Cardiac: Regular rate and rhythm, no S3 or significant systolic murmur, no pericardial rub. Abdomen: Bowel sounds present. Extremities: No pitting edema, distal pulses 2+.  ECG:  An ECG dated 07/11/2020 was personally reviewed today and demonstrated:  Sinus arrhythmia with R' in lead V1 and V2.  Recent Labwork: 06/03/2020: Magnesium 1.9 06/23/2020: ALT 14; AST 17; TSH  2.500 07/11/2020: BUN 19; Creatinine, Ser 0.69; Hemoglobin 13.5; Platelets 245; Potassium 3.7; Sodium 137     Component Value Date/Time   CHOL 182 06/23/2020 1314   TRIG 49 06/23/2020 1314   HDL 63 06/23/2020 1314   LDLCALC 109 (H) 06/23/2020 1314    Other Studies Reviewed Today:  No prior cardiac testing for review today.  Assessment and Plan:  Intermittent sense of palpitations as discussed above, also at times short of breath and feeling of tightness in her chest without provocation.  Baseline ECG shows sinus rhythm and normal intervals. Orthostatic vital signs were not diagnostic, she has a low normal blood pressure at baseline.  No evidence of POTS.  With possible COVID-19 history, we discussed obtaining an echocardiogram to ensure no evidence of cardiomyopathy, will also provide a 7-day Zio patch to exclude paroxysmal arrhythmia.  Otherwise recommended adequate hydration.  Medication Adjustments/Labs and Tests Ordered: Current medicines are reviewed at length with the patient today.  Concerns regarding medicines are outlined above.   Tests Ordered: Orders Placed This Encounter  Procedures  . ECHOCARDIOGRAM COMPLETE    Medication Changes: No orders of the defined types were placed in this encounter.   Disposition:  Follow up test results.  Signed, Jonelle Sidle, MD, [facility] 09/15/2020 11:25 AM    [facility] at [facility] [address], [place], [place] [address] Phone: [phone]; Fax: [phone]

## 2020-09-25 ENCOUNTER — Telehealth: Payer: Self-pay | Admitting: Cardiology

## 2020-09-25 NOTE — Telephone Encounter (Signed)
Contacted patient with no answer and no option to leave a voicemail d/t full mailbox. Will attempt again later.

## 2020-09-25 NOTE — Telephone Encounter (Signed)
Please give pt a call concerning the monitor she wore-   [phone]

## 2020-09-25 NOTE — Telephone Encounter (Signed)
Pt voiced understanding

## 2020-09-25 NOTE — Telephone Encounter (Signed)
Returned call to pt who stated that she was concerned that the results of her monitor would show that she pressed the button of her monitor several times while she had no symptoms. She stated that her kids would randomly press the button. She also stated that she would like to wear another monitor because she feels like the prior monitor that she wore only shows one episode. She states that she has the most episodes right around the time she is supposed to start her menstrual cycle, which is around this time. I told the pt I would send a message to Dr. Diona Browner for his input, but most time insurance would not pay for a second monitor so close to the first, and the pt informed me that she was paying out of pocket for the monitor due to no insurance. Please advise.

## 2020-09-25 NOTE — Telephone Encounter (Signed)
Let's wait and see what this monitor shows first.  Pressing the indicator button should not inhibit the recordings.

## 2020-10-13 ENCOUNTER — Ambulatory Visit (HOSPITAL_COMMUNITY)
Admission: RE | Admit: 2020-10-13 | Discharge: 2020-10-13 | Disposition: A | Discharge status: Home / Self Care | Payer: Self-pay | Source: Ambulatory Visit | Attending: Cardiology | Admitting: Cardiology

## 2020-10-13 ENCOUNTER — Other Ambulatory Visit: Payer: Self-pay

## 2020-10-13 DIAGNOSIS — R0602 Shortness of breath: Secondary | ICD-10-CM | POA: Insufficient documentation

## 2020-10-13 LAB — ECHOCARDIOGRAM COMPLETE
Area-P 1/2: 2.86 cm2
S' Lateral: 3 cm

## 2020-10-13 NOTE — Progress Notes (Signed)
*  PRELIMINARY RESULTS* Echocardiogram 2D Echocardiogram has been performed.  Stacey Drain 10/13/2020, 10:03 AM

## 2021-04-08 ENCOUNTER — Ambulatory Visit
Admission: EM | Admit: 2021-04-08 | Discharge: 2021-04-08 | Disposition: A | Discharge status: Home / Self Care | Payer: Self-pay | Attending: Family Medicine | Admitting: Family Medicine

## 2021-04-08 ENCOUNTER — Telehealth: Payer: Self-pay | Admitting: Family Medicine

## 2021-04-08 ENCOUNTER — Ambulatory Visit (INDEPENDENT_AMBULATORY_CARE_PROVIDER_SITE_OTHER): Payer: Self-pay

## 2021-04-08 ENCOUNTER — Other Ambulatory Visit: Payer: Self-pay

## 2021-04-08 DIAGNOSIS — R053 Chronic cough: Secondary | ICD-10-CM

## 2021-04-08 DIAGNOSIS — R059 Cough, unspecified: Secondary | ICD-10-CM

## 2021-04-08 DIAGNOSIS — R062 Wheezing: Secondary | ICD-10-CM

## 2021-04-08 DIAGNOSIS — R509 Fever, unspecified: Secondary | ICD-10-CM

## 2021-04-08 DIAGNOSIS — R0602 Shortness of breath: Secondary | ICD-10-CM

## 2021-04-08 MED ORDER — AZITHROMYCIN 250 MG PO TABS: 250.0000 mg | ORAL_TABLET | Freq: Every day | ORAL | 0 refills | Status: AC

## 2021-04-08 MED ORDER — ALBUTEROL SULFATE HFA 108 (90 BASE) MCG/ACT IN AERS: 1.0000 | INHALATION_SPRAY | Freq: Four times a day (QID) | RESPIRATORY_TRACT | 1 refills | Status: AC | PRN

## 2021-04-08 MED ORDER — PREDNISONE 20 MG PO TABS: 40.0000 mg | ORAL_TABLET | Freq: Every day | ORAL | 0 refills | Status: DC

## 2021-04-08 MED ORDER — ALBUTEROL SULFATE HFA 108 (90 BASE) MCG/ACT IN AERS: 1.0000 | INHALATION_SPRAY | Freq: Four times a day (QID) | RESPIRATORY_TRACT | 1 refills | Status: DC | PRN

## 2021-04-08 MED ORDER — AZITHROMYCIN 250 MG PO TABS: 250.0000 mg | ORAL_TABLET | Freq: Every day | ORAL | 0 refills | Status: DC

## 2021-04-08 NOTE — ED Provider Notes (Signed)
[facility]   [id number] 04/08/21 Arrival Time: 1642  ASSESSMENT & PLAN:  1. Persistent cough   2. Wheezing    I have personally viewed the imaging studies ordered this visit. No evidence of PNA.  OTC symptom care as needed. Given duration will tx with below: Meds ordered this encounter  Medications   albuterol (VENTOLIN HFA) 108 (90 Base) MCG/ACT inhaler    Sig: Inhale 1-2 puffs into the lungs every 6 (six) hours as needed for wheezing or shortness of breath.    Dispense:  1 each    Refill:  1   azithromycin (ZITHROMAX) 250 MG tablet    Sig: Take 1 tablet (250 mg total) by mouth daily. Take first 2 tablets together, then 1 every day until finished.    Dispense:  6 tablet    Refill:  0   predniSONE (DELTASONE) 20 MG tablet    Sig: Take 2 tablets (40 mg total) by mouth daily.    Dispense:  10 tablet    Refill:  0     Follow-up Information     Heather Roberts, NP.   Specialty: Nurse Practitioner Why: If worsening or failing to improve as anticipated. Contact information: [address]  [address] [place] [address] [phone]                 Reviewed expectations re: course of current medical issues. Questions answered. Outlined signs and symptoms indicating need for more acute intervention. Understanding verbalized. After Visit Summary given.   SUBJECTIVE: History from: patient. Holly Parks is a 30 y.o. female who reports: cough and congestion; x 1.5 weeks; fever today 101.5 F; very fatigued; occas CP with cough; occas SOB. Denies: sore throat and headache. Normal PO intake without n/v/d.  Social History   Tobacco Use  Smoking Status Never  Smokeless Tobacco Never     OBJECTIVE:  Vitals:   04/08/21 1718  BP: 105/71  Pulse: (!) 109  Resp: 17  Temp: 98.8 F (37.1 C)  TempSrc: Oral  SpO2: 97%    Slight tachycardia noted. General appearance: alert; no distress Eyes: PERRLA; EOMI; conjunctiva normal HENT: [place]; AT;  with nasal congestion Neck: supple  Lungs: speaks full sentences without difficulty; unlabored; coarse breath sounds bilaterally along with mild exp wheezing Extremities: no edema Skin: warm and dry Neurologic: normal gait Psychological: alert and cooperative; normal mood and affect   Imaging: DG Chest 2 View  Result Date: 04/08/2021 CLINICAL DATA:  Cough, fever and shortness of breath. EXAM: CHEST - 2 VIEW COMPARISON:  None. FINDINGS: Moderate bronchial thickening.The cardiomediastinal contours are normal. Pulmonary vasculature is normal. No consolidation, pleural effusion, or pneumothorax. No acute osseous abnormalities are seen. IMPRESSION: Moderate bronchial thickening suggesting bronchitis or asthma. No pneumonia. Electronically Signed   By: Narda Rutherford M.D.   On: 04/08/2021 18:14    Allergies  Allergen Reactions   Penicillins    Benadryl [Diphenhydramine Hcl] Rash    Past Medical History:  Diagnosis Date   Asthma    Back pain    Endometriosis 08/04/2020   Migraine    Social History   Socioeconomic History   Marital status: Single    Spouse name: Not on file   Number of children: Not on file   Years of education: Not on file   Highest education level: Not on file  Occupational History   Occupation: self employed- [employer]  Tobacco Use   Smoking status: Never   Smokeless  tobacco: Never  Vaping Use   Vaping Use: Never used  Substance and Sexual Activity   Alcohol use: Yes    Comment: Occasional   Drug use: No   Sexual activity: Yes    Birth control/protection: Condom  Other Topics Concern   Not on file  Social History Narrative   Not on file   Social Determinants of Health   Financial Resource Strain: Not on file  Food Insecurity: Not on file  Transportation Needs: Not on file  Physical Activity: Not on file  Stress: Not on file  Social Connections: Not on file  Intimate Partner Violence: Not on file   Family History  Problem  Relation Age of Onset   Hernia Mother    Other Mother        Spastic small bowel   Leukemia Father    Past Surgical History:  Procedure Laterality Date   PLANTAR'S WART EXCISION Bilateral    These were done when she was in High School   WISDOM TOOTH EXTRACTION Bilateral    Patient was 30 years old when this was done.     Mardella Layman, MD 04/08/21 [phone]

## 2021-04-08 NOTE — ED Triage Notes (Signed)
Pt reports nasal congestion x 1 week; shortnes of breath, cough and chest pain x 2 days; fever 101.0 F since this morning. States chest pain happens when she is moving.

## 2021-05-08 ENCOUNTER — Other Ambulatory Visit: Payer: Self-pay

## 2021-05-08 ENCOUNTER — Ambulatory Visit: Payer: Self-pay

## 2021-05-08 ENCOUNTER — Encounter: Payer: Self-pay | Admitting: Nurse Practitioner

## 2021-05-08 ENCOUNTER — Telehealth: Payer: Self-pay

## 2021-05-08 ENCOUNTER — Ambulatory Visit (INDEPENDENT_AMBULATORY_CARE_PROVIDER_SITE_OTHER): Payer: Self-pay | Admitting: Nurse Practitioner

## 2021-05-08 DIAGNOSIS — Z20822 Contact with and (suspected) exposure to covid-19: Secondary | ICD-10-CM

## 2021-05-08 DIAGNOSIS — J069 Acute upper respiratory infection, unspecified: Secondary | ICD-10-CM

## 2021-05-08 DIAGNOSIS — R6889 Other general symptoms and signs: Secondary | ICD-10-CM

## 2021-05-08 LAB — POCT INFLUENZA A/B
Influenza A, POC: NEGATIVE
Influenza B, POC: NEGATIVE

## 2021-05-08 MED ORDER — UNABLE TO FIND: 0 refills | Status: AC

## 2021-05-08 MED ORDER — METHYLPREDNISOLONE 4 MG PO TBPK: ORAL_TABLET | ORAL | 0 refills | Status: AC

## 2021-05-08 MED ORDER — ALBUTEROL SULFATE (2.5 MG/3ML) 0.083% IN NEBU: 2.5000 mg | INHALATION_SOLUTION | Freq: Four times a day (QID) | RESPIRATORY_TRACT | 1 refills | Status: AC | PRN

## 2021-05-08 NOTE — Progress Notes (Signed)
Acute Office Visit  Subjective:    Patient ID: Holly Parks, female    DOB: 05-Mar-1991, 30 y.o.   MRN: [id number]  Chief Complaint  Patient presents with   Cough    Possible flu    Fatigue   Sore Throat    Cough Associated symptoms include shortness of breath and wheezing. Pertinent negatives include no chills, fever, postnasal drip, rhinorrhea or sore throat.  Sore Throat  Associated symptoms include coughing and shortness of breath. Pertinent negatives include no congestion.  Patient is in today for sick visit. Symptoms started yesterday. She has tried taking vit C and D, cold-com, and tessalon perles.  She has had mild cough and SOB. No recent sick contacts.  She was sick on 04/08/21, but states she felt great for 2 weeks.   Past Medical History:  Diagnosis Date   Asthma    Back pain    Endometriosis 08/04/2020   Migraine     Past Surgical History:  Procedure Laterality Date   PLANTAR'S WART EXCISION Bilateral    These were done when she was in High School   WISDOM TOOTH EXTRACTION Bilateral    Patient was 30 years old when this was done.    Family History  Problem Relation Age of Onset   Hernia Mother    Other Mother        Spastic small bowel   Leukemia Father     Social History   Socioeconomic History   Marital status: Single    Spouse name: Not on file   Number of children: Not on file   Years of education: Not on file   Highest education level: Not on file  Occupational History   Occupation: self employed- [employer]  Tobacco Use   Smoking status: Never   Smokeless tobacco: Never  Vaping Use   Vaping Use: Never used  Substance and Sexual Activity   Alcohol use: Yes    Comment: Occasional   Drug use: No   Sexual activity: Yes    Birth control/protection: Condom  Other Topics Concern   Not on file  Social History Narrative   Not on file   Social Determinants of Health   Financial Resource Strain: Not on file  Food  Insecurity: Not on file  Transportation Needs: Not on file  Physical Activity: Not on file  Stress: Not on file  Social Connections: Not on file  Intimate Partner Violence: Not on file    Outpatient Medications Prior to Visit  Medication Sig Dispense Refill   albuterol (VENTOLIN HFA) 108 (90 Base) MCG/ACT inhaler Inhale 1-2 puffs into the lungs every 6 (six) hours as needed for wheezing or shortness of breath. 1 each 1   azithromycin (ZITHROMAX) 250 MG tablet Take 1 tablet (250 mg total) by mouth daily. Take first 2 tablets together, then 1 every day until finished. 6 tablet 0   cholecalciferol (VITAMIN D3) 25 MCG (1000 UNIT) tablet Take 1,000 Units by mouth daily.     Multiple Vitamin (MULTIVITAMIN WITH MINERALS) TABS tablet Take 1 tablet by mouth daily.     omega-3 acid ethyl esters (LOVAZA) 1 g capsule Take by mouth 2 (two) times daily.     Turmeric (QC TUMERIC COMPLEX PO) Take by mouth.     vitamin B-12 (CYANOCOBALAMIN) 500 MCG tablet Take 500 mcg by mouth daily.     vitamin C (ASCORBIC ACID) 500 MG tablet Take 500 mg by mouth daily.     predniSONE (  DELTASONE) 20 MG tablet Take 2 tablets (40 mg total) by mouth daily. 10 tablet 0   No facility-administered medications prior to visit.    Allergies  Allergen Reactions   Penicillins    Prednisone Swelling   Benadryl [Diphenhydramine Hcl] Rash    Review of Systems  Constitutional:  Positive for fatigue. Negative for chills and fever.  HENT:  Negative for congestion, postnasal drip, rhinorrhea and sore throat.   Respiratory:  Positive for cough, shortness of breath and wheezing.       Objective:    Physical Exam  There were no vitals taken for this visit. Wt Readings from Last 3 Encounters:  09/15/20 127 lb 12.8 oz (58 kg)  07/11/20 129 lb (58.5 kg)  06/26/20 128 lb (58.1 kg)    Health Maintenance Due  Topic Date Due   COVID-19 Vaccine (1) Never done   Pneumococcal Vaccine 41-71 Years old (1 - PCV) Never done    PAP-Cervical Cytology Screening  Never done   PAP SMEAR-Modifier  Never done   INFLUENZA VACCINE  12/22/2020    There are no preventive care reminders to display for this patient.   Lab Results  Component Value Date   TSH 2.500 06/23/2020   Lab Results  Component Value Date   WBC 6.8 07/11/2020   HGB 13.5 07/11/2020   HCT 41.0 07/11/2020   MCV 92.1 07/11/2020   PLT 245 07/11/2020   Lab Results  Component Value Date   NA 137 07/11/2020   K 3.7 07/11/2020   CO2 26 07/11/2020   GLUCOSE 100 (H) 07/11/2020   BUN 19 07/11/2020   CREATININE 0.69 07/11/2020   BILITOT 0.4 06/23/2020   ALKPHOS 66 06/23/2020   AST 17 06/23/2020   ALT 14 06/23/2020   PROT 7.4 06/23/2020   ALBUMIN 4.5 06/23/2020   CALCIUM 8.9 07/11/2020   ANIONGAP 4 (L) 07/11/2020   Lab Results  Component Value Date   CHOL 182 06/23/2020   Lab Results  Component Value Date   HDL 63 06/23/2020   Lab Results  Component Value Date   LDLCALC 109 (H) 06/23/2020   Lab Results  Component Value Date   TRIG 49 06/23/2020   No results found for: CHOLHDL No results found for: NUUV2Z     Assessment & Plan:   Problem List Items Addressed This Visit       Respiratory   URI (upper respiratory infection) - Primary    -pt has SOB and wheezing at baseline, but this is worse d/t URI -she had swelling with prednisone, so we will try a medrol dose pack to help with wheezing, and she will take a 1 tab test dose before completing the first day's dose -Rx. Albuterol and nebulizer -will get flu and covid test      Relevant Orders   For home use only DME Nebulizer machine   Novel Coronavirus, NAA ([facility])   POCT Influenza A/B     Meds ordered this encounter  Medications   albuterol (PROVENTIL) (2.5 MG/3ML) 0.083% nebulizer solution    Sig: Take 3 mLs (2.5 mg total) by nebulization every 6 (six) hours as needed for wheezing or shortness of breath.    Dispense:  150 mL    Refill:  1   methylPREDNISolone  (MEDROL DOSEPAK) 4 MG TBPK tablet    Sig: For today's dose, take 1 tablet 4 hours prior to taking the rest since you had swelling with prednisone. If you have no swelling, you can  take the rest of the dose. If you have swelling, STOP immediately.    Dispense:  21 tablet    Refill:  0    Date:  05/08/2021   Location of Patient: Home Location of Provider: Office Consent was obtain for visit to be over via telehealth. I verified that I am speaking with the correct person using two identifiers.  I connected with  Pilar Plate on 05/08/21 via telephone and verified that I am speaking with the correct person using two identifiers.   I discussed the limitations of evaluation and management by telemedicine. The patient expressed understanding and agreed to proceed.  Time spent: 8 minutes   Heather Roberts, NP

## 2021-05-08 NOTE — Assessment & Plan Note (Signed)
-  pt has SOB and wheezing at baseline, but this is worse d/t URI -she had swelling with prednisone, so we will try a medrol dose pack to help with wheezing, and she will take a 1 tab test dose before completing the first day's dose -Rx. Albuterol and nebulizer -will get flu and covid test

## 2021-05-08 NOTE — Telephone Encounter (Signed)
Pt states she did not have a fever this AM when she had her appt but she is now running a 102.7 fever  do you want to change anything you ordered she is negative for flu.

## 2021-05-08 NOTE — Addendum Note (Signed)
Addended by: Cindra Presume D on: 05/08/2021 04:02 PM   Modules accepted: Orders

## 2021-05-09 ENCOUNTER — Encounter: Payer: Self-pay | Admitting: Nurse Practitioner

## 2021-05-10 LAB — NOVEL CORONAVIRUS, NAA: SARS-CoV-2, NAA: DETECTED — AB

## 2021-05-10 LAB — SARS-COV-2, NAA 2 DAY TAT

## 2021-05-11 ENCOUNTER — Other Ambulatory Visit: Payer: Self-pay | Admitting: Nurse Practitioner

## 2021-05-11 MED ORDER — NIRMATRELVIR/RITONAVIR (PAXLOVID)TABLET: 3.0000 | ORAL_TABLET | Freq: Two times a day (BID) | ORAL | 0 refills | Status: DC

## 2021-05-11 MED ORDER — NIRMATRELVIR/RITONAVIR (PAXLOVID)TABLET: 3.0000 | ORAL_TABLET | Freq: Two times a day (BID) | ORAL | 0 refills | Status: AC

## 2021-05-11 NOTE — Telephone Encounter (Signed)
She tested positive for COVID. I sent in paxlovid, the COVID-specific antiviral. OTC tylenol is fine for fever.

## 2021-05-12 NOTE — Telephone Encounter (Signed)
Called and notified patient of providers recommendations.  

## 2021-05-25 ENCOUNTER — Telehealth: Payer: Self-pay | Admitting: Nurse Practitioner

## 2021-05-25 NOTE — Telephone Encounter (Signed)
° °  Pt called this AM to report that she awoke in the middle of the night to use the bathroom and upon getting out of bed, she felt very dizzy (room spinning), and that she might lose consciousness.  After voiding on the toilet, dizziness persisted and upon laying down, she felt as though she was in and out of consciousness.  She also noted tachypalpitations at that point, along with numbness of her upper and lower extremities.  She became nauseated and vomited x 1.  Ss persisted for ~ 2 hrs in total, and then she fell off to sleep.  This AM, she just feels "foggy."  No further dizziness, per se, no further tachypalps/weakness/n/v.  We discussed possible etiologies.  Ss sound primarily vertiginous in nature.  I rec adequate hydration and prn otc dramamine.  If symptoms return, she will need to be eval in urgent care/ED, otw f/u pcp.  If she has return of palpitations, she should notify us, as we would have a low threshold to re-place a monitor (prev wore 09/2020 - rare PACs/PVCs w/o significant arrhythmias).  Caller verbalized understanding and was grateful for the call back.  Nicolasa Ducking, NP 05/25/2021, 9:03 AM

## 2021-07-02 ENCOUNTER — Encounter: Payer: Self-pay | Admitting: Nurse Practitioner

## 2022-03-10 ENCOUNTER — Ambulatory Visit
Admission: RE | Admit: 2022-03-10 | Discharge: 2022-03-10 | Disposition: A | Discharge status: Home / Self Care | Payer: Self-pay | Source: Ambulatory Visit | Attending: Family Medicine | Admitting: Family Medicine

## 2022-03-10 VITALS — BP 106/67 | HR 100 | Temp 99.7°F | Resp 16

## 2022-03-10 DIAGNOSIS — R110 Nausea: Secondary | ICD-10-CM

## 2022-03-10 DIAGNOSIS — R1010 Upper abdominal pain, unspecified: Secondary | ICD-10-CM

## 2022-03-10 MED ORDER — ONDANSETRON 8 MG PO TBDP
8.0000 mg | ORAL_TABLET | Freq: Once | ORAL | Status: AC
  Administered 2022-03-10: 8 mg via ORAL

## 2022-03-10 MED ORDER — ONDANSETRON 4 MG PO TBDP: 4.0000 mg | ORAL_TABLET | Freq: Three times a day (TID) | ORAL | 0 refills | Status: AC | PRN

## 2022-03-10 NOTE — ED Triage Notes (Signed)
Pt reports epigastric pain,  weak, ligh headed, chills, nausea since this morning. Pt reports las time she felt like this her potassium was low. Pt also reports in the past she had some "spells like this" when ovulating.

## 2022-03-10 NOTE — ED Notes (Signed)
Pt vomiting. PA aware and assessing pt. Pt emergency contact called for transport home.

## 2022-03-10 NOTE — ED Provider Notes (Addendum)
[facility]    CSN: [id number] Arrival date & time: 03/10/22  1519      History   Chief Complaint Chief Complaint  Patient presents with   Abdominal Pain    very weak, claimy light headedness, chills, nauseous. - Entered by patient   Chills   Nausea    HPI Holly Parks is a 31 y.o. female.   Patient presenting today with epigastric abdominal pain, nausea, chills, weakness and fatigue since waking up this morning.  States symptoms become worse with standing.  Denies dizziness, headache, fever, vomiting, diarrhea, new foods or medications, known sick contacts recently.  So far not trying any medications for symptoms.  Has tolerated 2 bananas and 30 ounces of water so far today.    Past Medical History:  Diagnosis Date   Asthma    Back pain    Endometriosis 08/04/2020   Migraine     Patient Active Problem List   Diagnosis Date Noted   URI (upper respiratory infection) 05/08/2021   Menstrual pain 06/26/2020   Preventative health care 06/12/2020   Asthma 06/12/2020   Abnormal CT scan, small bowel 06/05/2020   Elevated LFTs 06/05/2020   Injury due to laser 12/21/2016    Past Surgical History:  Procedure Laterality Date   PLANTAR'S WART EXCISION Bilateral    These were done when she was in [place] Bilateral    Patient was 31 years old when this was done.    OB History   No obstetric history on file.      Home Medications    Prior to Admission medications   Medication Sig Start Date End Date Taking? Authorizing Provider  ondansetron (ZOFRAN-ODT) 4 MG disintegrating tablet Take 1 tablet (4 mg total) by mouth every 8 (eight) hours as needed for nausea or vomiting. 03/10/22  Yes Volney American, PA-C  albuterol (PROVENTIL) (2.5 MG/3ML) 0.083% nebulizer solution Take 3 mLs (2.5 mg total) by nebulization every 6 (six) hours as needed for wheezing or shortness of breath. 05/08/21   Noreene Larsson, NP  albuterol  (VENTOLIN HFA) 108 (90 Base) MCG/ACT inhaler Inhale 1-2 puffs into the lungs every 6 (six) hours as needed for wheezing or shortness of breath. 04/08/21   Vanessa Kick, MD  azithromycin (ZITHROMAX) 250 MG tablet Take 1 tablet (250 mg total) by mouth daily. Take first 2 tablets together, then 1 every day until finished. 04/08/21   Vanessa Kick, MD  cholecalciferol (VITAMIN D3) 25 MCG (1000 UNIT) tablet Take 1,000 Units by mouth daily.    [provider]  methylPREDNISolone (MEDROL DOSEPAK) 4 MG TBPK tablet For today's dose, take 1 tablet 4 hours prior to taking the rest since you had swelling with prednisone. If you have no swelling, you can take the rest of the dose. If you have swelling, STOP immediately. 05/08/21   Noreene Larsson, NP  Multiple Vitamin (MULTIVITAMIN WITH MINERALS) TABS tablet Take 1 tablet by mouth daily.    [provider]  omega-3 acid ethyl esters (LOVAZA) 1 g capsule Take by mouth 2 (two) times daily.    [provider]  Turmeric (QC TUMERIC COMPLEX PO) Take by mouth.    [provider]  UNABLE TO FIND Nebulizer mouthpiece and tubing  DX J45.909 05/08/21   Fayrene Helper, MD  vitamin B-12 (CYANOCOBALAMIN) 500 MCG tablet Take 500 mcg by mouth daily.    [provider]  vitamin C (ASCORBIC ACID) 500  MG tablet Take 500 mg by mouth daily.    [provider]    Family History Family History  Problem Relation Age of Onset   Hernia Mother    Other Mother        Spastic small bowel   Leukemia Father     Social History Social History   Tobacco Use   Smoking status: Never   Smokeless tobacco: Never  Vaping Use   Vaping Use: Never used  Substance Use Topics   Alcohol use: Yes    Comment: Occasional   Drug use: No     Allergies   Penicillins, Prednisone, and Benadryl [diphenhydramine hcl]   Review of Systems Review of Systems Per HPI  Physical Exam Triage Vital Signs ED Triage Vitals  Enc Vitals  Group     BP 03/10/22 1608 105/70     Pulse Rate 03/10/22 1607 100     Resp 03/10/22 1607 16     Temp 03/10/22 1607 99.7 F (37.6 C)     Temp Source 03/10/22 1607 Oral     SpO2 03/10/22 1608 97 %     Weight --      Height --      Head Circumference --      Peak Flow --      Pain Score 03/10/22 1606 7     Pain Loc --      Pain Edu? --      Excl. in GC? --    No data found.  Updated Vital Signs BP 105/70 (BP Location: Right Arm)   Pulse 100   Temp 99.7 F (37.6 C) (Oral)   Resp 16   LMP 03/01/2022 (Exact Date)   SpO2 97%   Visual Acuity Right Eye Distance:   Left Eye Distance:   Bilateral Distance:    Right Eye Near:   Left Eye Near:    Bilateral Near:     Physical Exam Vitals and nursing note reviewed.  Constitutional:      Appearance: Normal appearance. She is not ill-appearing.  HENT:     Head: Atraumatic.     Mouth/Throat:     Mouth: Mucous membranes are moist.  Eyes:     Extraocular Movements: Extraocular movements intact.     Conjunctiva/sclera: Conjunctivae normal.  Cardiovascular:     Rate and Rhythm: Normal rate and regular rhythm.     Heart sounds: Normal heart sounds.  Pulmonary:     Effort: Pulmonary effort is normal.     Breath sounds: Normal breath sounds.  Abdominal:     General: Bowel sounds are normal. There is no distension.     Palpations: Abdomen is soft.     Tenderness: There is abdominal tenderness. There is no right CVA tenderness, left CVA tenderness or guarding.     Comments: Mild epigastric tenderness to palpation without distention or guarding  Musculoskeletal:        General: Normal range of motion.     Cervical back: Normal range of motion and neck supple.  Skin:    General: Skin is warm and dry.  Neurological:     Mental Status: She is alert and oriented to person, place, and time.  Psychiatric:        Mood and Affect: Mood normal.        Thought Content: Thought content normal.        Judgment: Judgment normal.    UC  Treatments / Results  Labs (all labs ordered are listed,  but only abnormal results are displayed) Labs Reviewed  CBC WITH DIFFERENTIAL/PLATELET  COMPREHENSIVE METABOLIC PANEL  LIPASE    EKG   Radiology No results found.  Procedures Procedures (including critical care time)  Medications Ordered in UC Medications  ondansetron (ZOFRAN-ODT) disintegrating tablet 8 mg (has no administration in time range)    Initial Impression / Assessment and Plan / UC Course  I have reviewed the triage vital signs and the nursing notes.  Pertinent labs & imaging results that were available during my care of the patient were reviewed by me and considered in my medical decision making (see chart for details).     Vital signs and exam overall reassuring with no red flag findings.  Suspect viral GI illness, however will obtain labs to further evaluate symptoms.  Treat with Zofran, brat diet, fluids while awaiting lab results.  ED for worsening symptoms.  Addendum-just after blood work was obtained in clinic, patient began actively vomiting so 8 mg of Zofran was given and patient was monitored for improvement in symptoms.  Her emergency contact was called per her request to come drive her home once discharged  Final Clinical Impressions(s) / UC Diagnoses   Final diagnoses:  Upper abdominal pain  Nausea without vomiting   Discharge Instructions   None    ED Prescriptions     Medication Sig Dispense Auth. Provider   ondansetron (ZOFRAN-ODT) 4 MG disintegrating tablet Take 1 tablet (4 mg total) by mouth every 8 (eight) hours as needed for nausea or vomiting. 20 tablet Particia Nearing, [place]      PDMP not reviewed this encounter.   Particia Nearing, PA-C 03/10/22 1631    Particia Nearing, [place] 03/10/22 [phone]

## 2022-03-11 LAB — CBC WITH DIFFERENTIAL/PLATELET
Basophils Absolute: 0 10*3/uL (ref 0.0–0.2)
Basos: 0 %
EOS (ABSOLUTE): 0 10*3/uL (ref 0.0–0.4)
Eos: 0 %
Hematocrit: 43.3 % (ref 34.0–46.6)
Hemoglobin: 14.7 g/dL (ref 11.1–15.9)
Immature Grans (Abs): 0.2 10*3/uL — ABNORMAL HIGH (ref 0.0–0.1)
Immature Granulocytes: 1 %
Lymphocytes Absolute: 0.5 10*3/uL — ABNORMAL LOW (ref 0.7–3.1)
Lymphs: 5 %
MCH: 30.1 pg (ref 26.6–33.0)
MCHC: 33.9 g/dL (ref 31.5–35.7)
MCV: 89 fL (ref 79–97)
Monocytes Absolute: 0.3 10*3/uL (ref 0.1–0.9)
Monocytes: 3 %
Neutrophils Absolute: 9.6 10*3/uL — ABNORMAL HIGH (ref 1.4–7.0)
Neutrophils: 91 %
Platelets: 259 10*3/uL (ref 150–450)
RBC: 4.89 x10E6/uL (ref 3.77–5.28)
RDW: 11.8 % (ref 11.7–15.4)
WBC: 10.6 10*3/uL (ref 3.4–10.8)

## 2022-03-11 LAB — COMPREHENSIVE METABOLIC PANEL
ALT: 15 IU/L (ref 0–32)
AST: 18 IU/L (ref 0–40)
Albumin/Globulin Ratio: 1.7 (ref 1.2–2.2)
Albumin: 4.7 g/dL (ref 4.0–5.0)
Alkaline Phosphatase: 66 IU/L (ref 44–121)
BUN/Creatinine Ratio: 23 (ref 9–23)
BUN: 19 mg/dL (ref 6–20)
Bilirubin Total: 0.6 mg/dL (ref 0.0–1.2)
CO2: 21 mmol/L (ref 20–29)
Calcium: 9 mg/dL (ref 8.7–10.2)
Chloride: 102 mmol/L (ref 96–106)
Creatinine, Ser: 0.84 mg/dL (ref 0.57–1.00)
Globulin, Total: 2.7 g/dL (ref 1.5–4.5)
Glucose: 99 mg/dL (ref 70–99)
Potassium: 4 mmol/L (ref 3.5–5.2)
Sodium: 139 mmol/L (ref 134–144)
Total Protein: 7.4 g/dL (ref 6.0–8.5)
eGFR: 97 mL/min/{1.73_m2} (ref >59)

## 2022-03-11 LAB — LIPASE: Lipase: 31 U/L (ref 14–72)

## 2022-05-27 IMAGING — CT CT ABD-PELV W/ CM
2 of 4 series · 16 of 46 positions shown, 18 images · IV contrast (Omnipaque or Isovue)
Comparison: None.

CLINICAL DATA: Right lower quadrant abdominal pain.

EXAM:
CT ABDOMEN AND PELVIS WITH CONTRAST
TECHNIQUE: Multidetector CT imaging of the abdomen and pelvis was performed
using the standard protocol following bolus administration of
intravenous contrast.
CONTRAST:  75mL OMNIPAQUE IOHEXOL 300 MG/ML  SOLN

[Series 2: axial st · axial · 0.67mm/px · z∈[+667,+1067]mm · 13 of 88 slices shown, 15 images]
[im 4/88  soft-tissue]
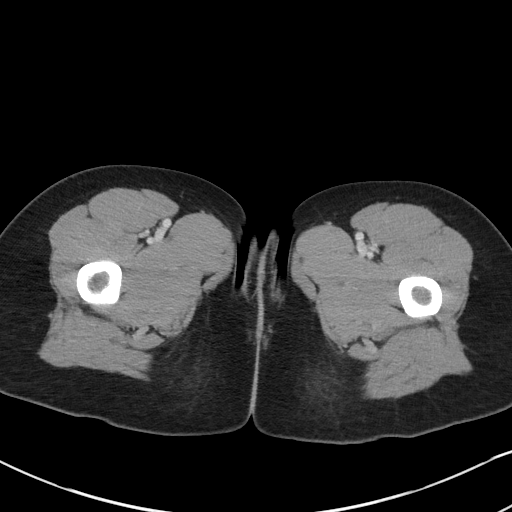
[im 4/88  bone]
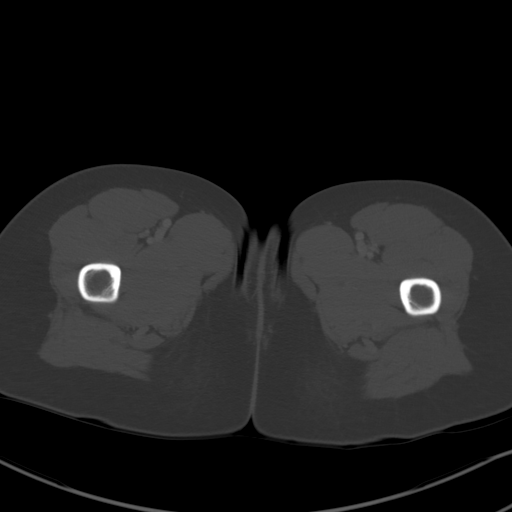
[im 11/88  soft-tissue]
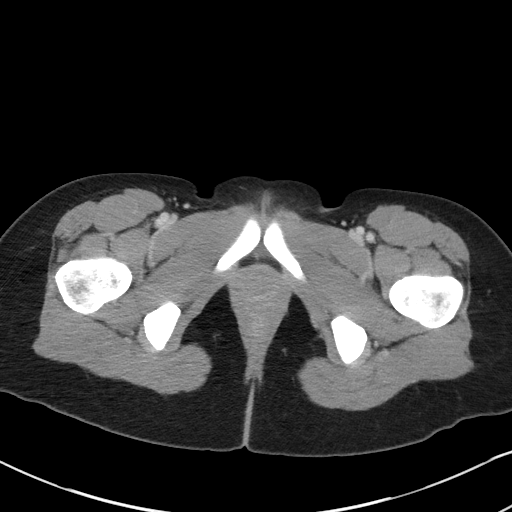
[im 19/88  soft-tissue]
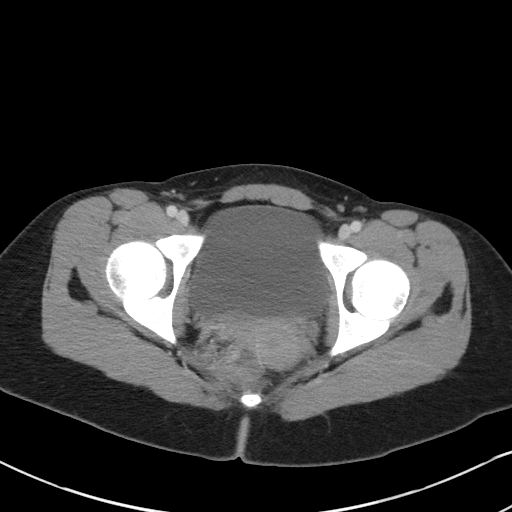
[im 26/88  soft-tissue]
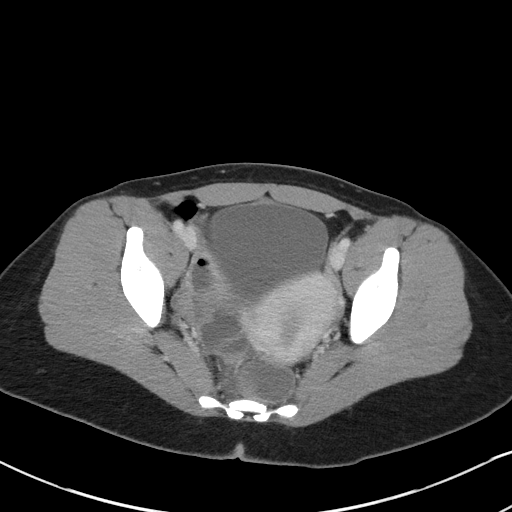
[im 30/88  soft-tissue]
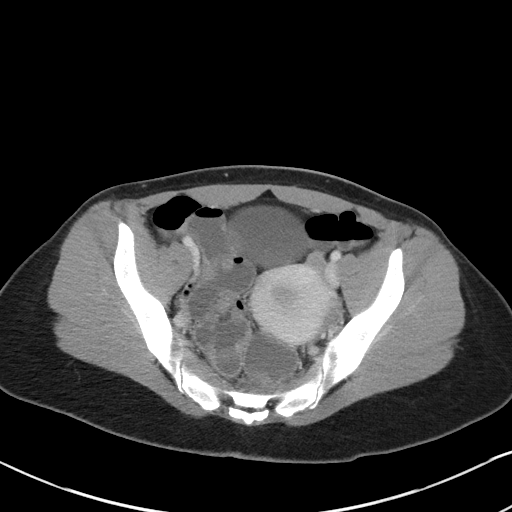
[im 37/88  soft-tissue]
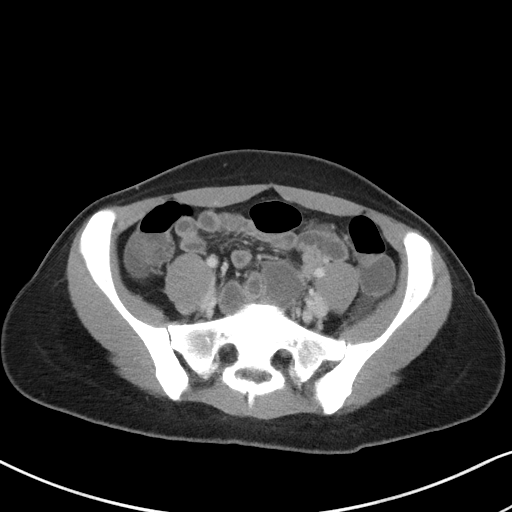
[im 44/88  soft-tissue]
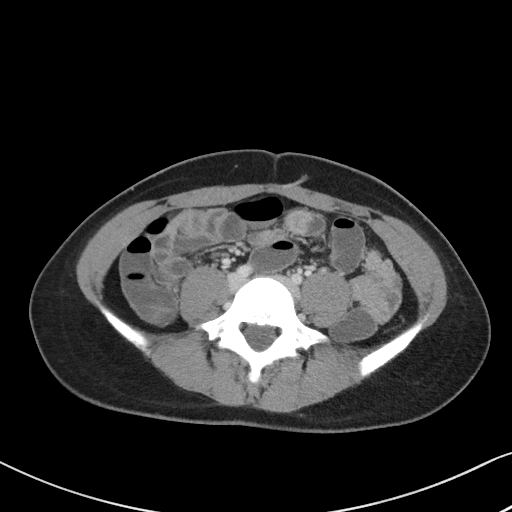
[im 51/88  soft-tissue]
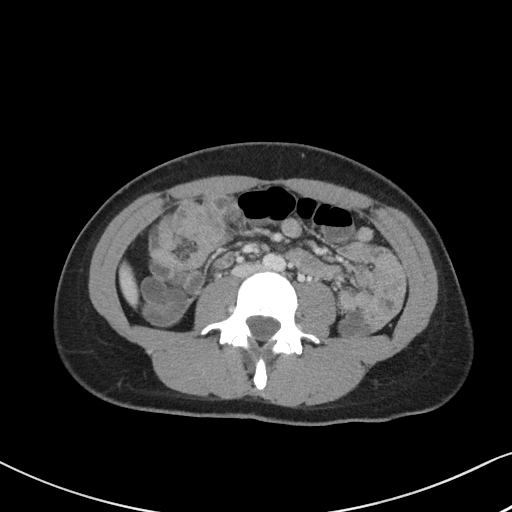
[im 59/88  soft-tissue]
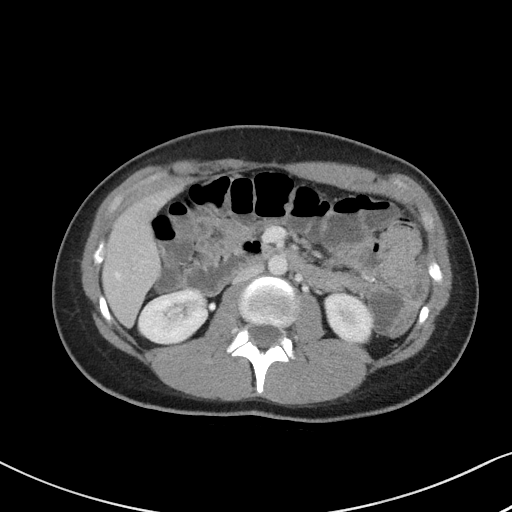
[im 59/88  bone]
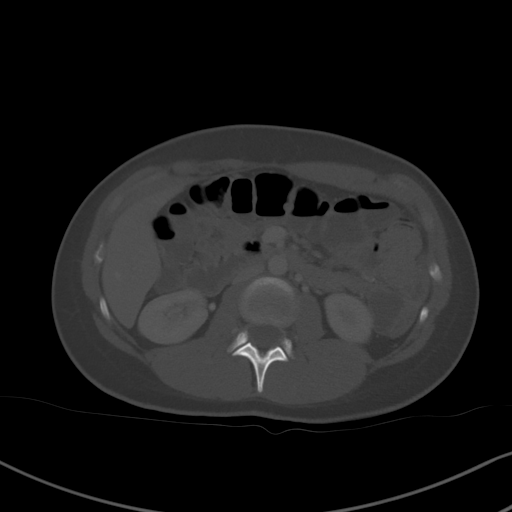
[im 62/88  soft-tissue]
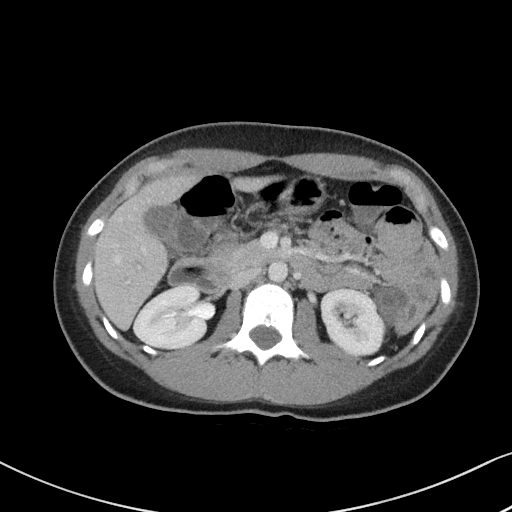
[im 69/88  soft-tissue]
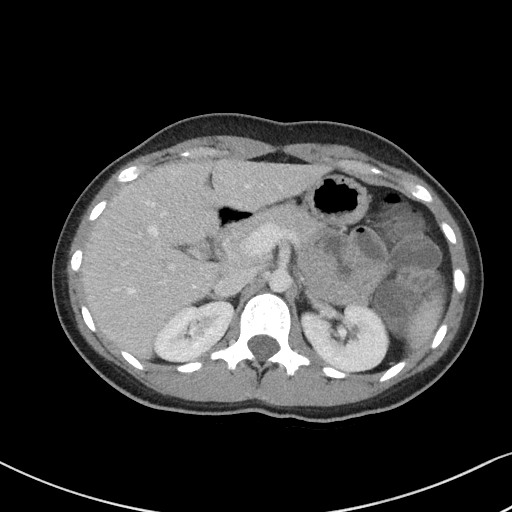
[im 77/88  soft-tissue]
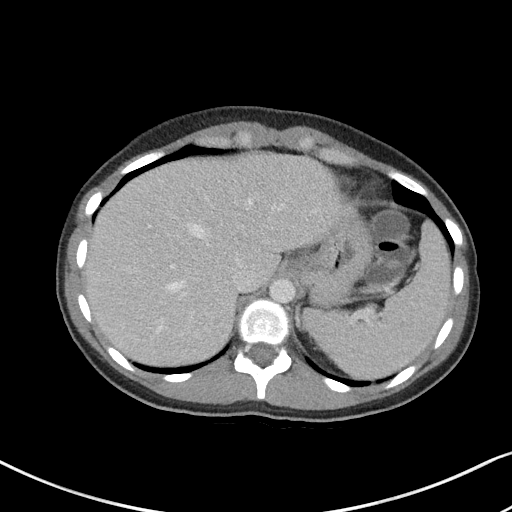
[im 84/88  soft-tissue]
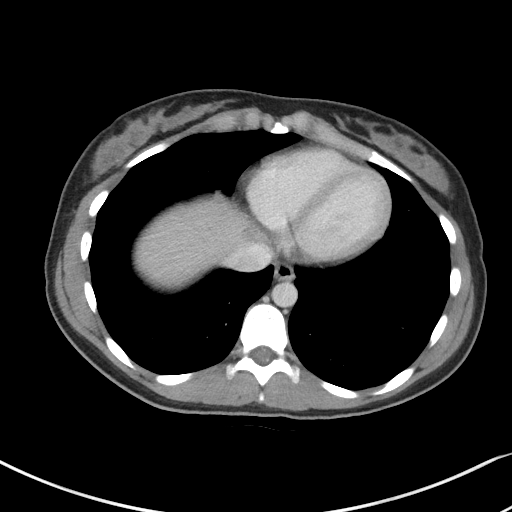

[Series 5: coronal st · coronal · 0.78mm/px · 3 of 90 slices shown]
[im 30/90  soft-tissue]
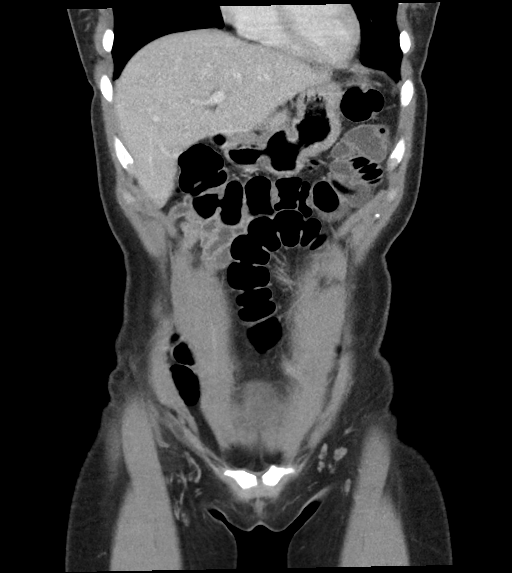
[im 40/90  soft-tissue]
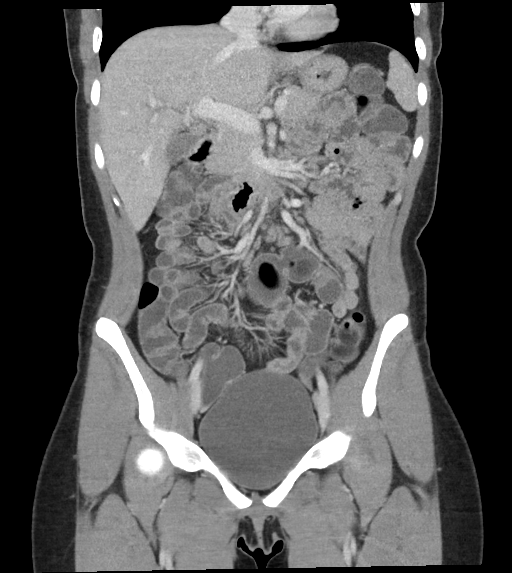
[im 50/90  soft-tissue]
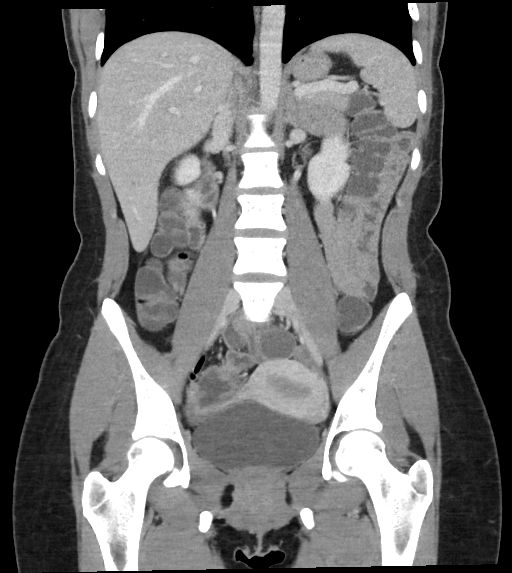

[16 of 46 positions shown; findings below may reference images not displayed]

FINDINGS: Lower chest: The lung bases are clear. The heart size is normal.

Hepatobiliary: The liver is normal. Normal gallbladder.There is no
biliary ductal dilation.

Pancreas: Normal contours without ductal dilatation. No
peripancreatic fluid collection.

Spleen: Unremarkable.

Adrenals/Urinary Tract:

--Adrenal glands: Unremarkable.

--Right kidney/ureter: No hydronephrosis or radiopaque kidney
stones.

--Left kidney/ureter: No hydronephrosis or radiopaque kidney stones.

--Urinary bladder: Unremarkable.

Stomach/Bowel:

--Stomach/Duodenum: No hiatal hernia or other gastric abnormality.
Normal duodenal course and caliber.

--Small bowel: There is scattered fluid-filled loops of small bowel
in the abdomen. There is a focal intussusception of small bowel in
the midline abdomen (axial series 2, image 43). There is apparent
wall thickening of the small bowel at this level.

--Colon: There is liquid stool throughout the colon

--Appendix: Normal.

Vascular/Lymphatic: Normal course and caliber of the major abdominal
vessels.

--No retroperitoneal lymphadenopathy.

--No mesenteric lymphadenopathy.

--No pelvic or inguinal lymphadenopathy.

Reproductive: Unremarkable

Other: No ascites or free air. The abdominal wall is normal.

Musculoskeletal. No acute displaced fractures.
IMPRESSION: 1. Normal appendix.
2. Incidentally noted intussusception in the mid abdomen as detailed
above. There is no evidence for obstruction at this level. There is
mild wall thickening of the loops of small bowel within the
intussusception which may be secondary to an underlying enteritis.
3. Liquid stool throughout the colon consistent with a diarrheal
illness.
